# Patient Record
Sex: Female | Born: 1940 | Race: White | Hispanic: No | Marital: Single
Health system: Southern US, Community
[De-identification: ages and names within clinical notes are randomized; demographics above are authoritative.]

## PROBLEM LIST (undated history)

## (undated) DIAGNOSIS — I10 Essential (primary) hypertension: Secondary | ICD-10-CM

## (undated) DIAGNOSIS — C801 Malignant (primary) neoplasm, unspecified: Secondary | ICD-10-CM

## (undated) DIAGNOSIS — F329 Major depressive disorder, single episode, unspecified: Secondary | ICD-10-CM

## (undated) DIAGNOSIS — F32A Depression, unspecified: Secondary | ICD-10-CM

## (undated) DIAGNOSIS — F419 Anxiety disorder, unspecified: Secondary | ICD-10-CM

## (undated) HISTORY — DX: Anxiety disorder, unspecified: F41.9

---
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
## 1981-08-16 DIAGNOSIS — C801 Malignant (primary) neoplasm, unspecified: Secondary | ICD-10-CM

## 1981-08-16 HISTORY — DX: Malignant (primary) neoplasm, unspecified: C80.1

## 2013-08-20 DIAGNOSIS — Z79899 Other long term (current) drug therapy: Secondary | ICD-10-CM | POA: Diagnosis not present

## 2013-08-21 DIAGNOSIS — Z79899 Other long term (current) drug therapy: Secondary | ICD-10-CM | POA: Diagnosis not present

## 2013-08-21 DIAGNOSIS — L708 Other acne: Secondary | ICD-10-CM | POA: Diagnosis not present

## 2013-08-27 DIAGNOSIS — F332 Major depressive disorder, recurrent severe without psychotic features: Secondary | ICD-10-CM | POA: Diagnosis not present

## 2013-09-24 DIAGNOSIS — Z79899 Other long term (current) drug therapy: Secondary | ICD-10-CM | POA: Diagnosis not present

## 2013-09-24 DIAGNOSIS — L708 Other acne: Secondary | ICD-10-CM | POA: Diagnosis not present

## 2013-10-02 DIAGNOSIS — F332 Major depressive disorder, recurrent severe without psychotic features: Secondary | ICD-10-CM | POA: Diagnosis not present

## 2013-10-12 DIAGNOSIS — R002 Palpitations: Secondary | ICD-10-CM | POA: Diagnosis not present

## 2013-10-12 DIAGNOSIS — E785 Hyperlipidemia, unspecified: Secondary | ICD-10-CM | POA: Diagnosis not present

## 2013-10-12 DIAGNOSIS — F329 Major depressive disorder, single episode, unspecified: Secondary | ICD-10-CM | POA: Diagnosis not present

## 2013-10-12 DIAGNOSIS — F3289 Other specified depressive episodes: Secondary | ICD-10-CM | POA: Diagnosis not present

## 2013-10-31 DIAGNOSIS — Z79899 Other long term (current) drug therapy: Secondary | ICD-10-CM | POA: Diagnosis not present

## 2013-10-31 DIAGNOSIS — L708 Other acne: Secondary | ICD-10-CM | POA: Diagnosis not present

## 2013-11-09 DIAGNOSIS — F332 Major depressive disorder, recurrent severe without psychotic features: Secondary | ICD-10-CM | POA: Diagnosis not present

## 2013-11-29 DIAGNOSIS — D1430 Benign neoplasm of unspecified bronchus and lung: Secondary | ICD-10-CM | POA: Diagnosis not present

## 2013-11-29 DIAGNOSIS — R911 Solitary pulmonary nodule: Secondary | ICD-10-CM | POA: Diagnosis not present

## 2013-12-10 DIAGNOSIS — F332 Major depressive disorder, recurrent severe without psychotic features: Secondary | ICD-10-CM | POA: Diagnosis not present

## 2014-01-01 DIAGNOSIS — F332 Major depressive disorder, recurrent severe without psychotic features: Secondary | ICD-10-CM | POA: Diagnosis not present

## 2014-01-10 DIAGNOSIS — H40009 Preglaucoma, unspecified, unspecified eye: Secondary | ICD-10-CM | POA: Diagnosis not present

## 2014-01-10 DIAGNOSIS — H04129 Dry eye syndrome of unspecified lacrimal gland: Secondary | ICD-10-CM | POA: Diagnosis not present

## 2014-01-10 DIAGNOSIS — H2510 Age-related nuclear cataract, unspecified eye: Secondary | ICD-10-CM | POA: Diagnosis not present

## 2014-01-18 DIAGNOSIS — H40009 Preglaucoma, unspecified, unspecified eye: Secondary | ICD-10-CM | POA: Diagnosis not present

## 2014-01-18 DIAGNOSIS — H2510 Age-related nuclear cataract, unspecified eye: Secondary | ICD-10-CM | POA: Diagnosis not present

## 2014-01-29 DIAGNOSIS — F332 Major depressive disorder, recurrent severe without psychotic features: Secondary | ICD-10-CM | POA: Diagnosis not present

## 2014-02-12 DIAGNOSIS — H2510 Age-related nuclear cataract, unspecified eye: Secondary | ICD-10-CM | POA: Diagnosis not present

## 2014-02-20 DIAGNOSIS — H4050X0 Glaucoma secondary to other eye disorders, unspecified eye, stage unspecified: Secondary | ICD-10-CM | POA: Diagnosis not present

## 2014-02-20 DIAGNOSIS — H2510 Age-related nuclear cataract, unspecified eye: Secondary | ICD-10-CM | POA: Diagnosis not present

## 2014-02-20 DIAGNOSIS — T8529XA Other mechanical complication of intraocular lens, initial encounter: Secondary | ICD-10-CM | POA: Diagnosis not present

## 2014-02-20 DIAGNOSIS — IMO0002 Reserved for concepts with insufficient information to code with codable children: Secondary | ICD-10-CM | POA: Diagnosis not present

## 2014-02-20 DIAGNOSIS — H269 Unspecified cataract: Secondary | ICD-10-CM | POA: Diagnosis not present

## 2014-02-20 DIAGNOSIS — H259 Unspecified age-related cataract: Secondary | ICD-10-CM | POA: Diagnosis not present

## 2014-02-21 DIAGNOSIS — F3289 Other specified depressive episodes: Secondary | ICD-10-CM | POA: Diagnosis not present

## 2014-02-21 DIAGNOSIS — M129 Arthropathy, unspecified: Secondary | ICD-10-CM | POA: Diagnosis not present

## 2014-02-21 DIAGNOSIS — F329 Major depressive disorder, single episode, unspecified: Secondary | ICD-10-CM | POA: Diagnosis not present

## 2014-02-21 DIAGNOSIS — T8529XA Other mechanical complication of intraocular lens, initial encounter: Secondary | ICD-10-CM | POA: Diagnosis not present

## 2014-02-21 DIAGNOSIS — I4892 Unspecified atrial flutter: Secondary | ICD-10-CM | POA: Diagnosis not present

## 2014-02-27 DIAGNOSIS — H4440 Unspecified hypotony of eye: Secondary | ICD-10-CM | POA: Diagnosis not present

## 2014-02-27 DIAGNOSIS — H318 Other specified disorders of choroid: Secondary | ICD-10-CM | POA: Diagnosis not present

## 2014-02-27 DIAGNOSIS — H35349 Macular cyst, hole, or pseudohole, unspecified eye: Secondary | ICD-10-CM | POA: Diagnosis not present

## 2014-02-28 DIAGNOSIS — H318 Other specified disorders of choroid: Secondary | ICD-10-CM | POA: Diagnosis not present

## 2014-02-28 DIAGNOSIS — H3090 Unspecified chorioretinal inflammation, unspecified eye: Secondary | ICD-10-CM | POA: Diagnosis not present

## 2014-02-28 DIAGNOSIS — H4440 Unspecified hypotony of eye: Secondary | ICD-10-CM | POA: Diagnosis not present

## 2014-03-15 DIAGNOSIS — E538 Deficiency of other specified B group vitamins: Secondary | ICD-10-CM | POA: Diagnosis not present

## 2014-03-15 DIAGNOSIS — E785 Hyperlipidemia, unspecified: Secondary | ICD-10-CM | POA: Diagnosis not present

## 2014-03-15 DIAGNOSIS — E559 Vitamin D deficiency, unspecified: Secondary | ICD-10-CM | POA: Diagnosis not present

## 2014-03-19 DIAGNOSIS — M159 Polyosteoarthritis, unspecified: Secondary | ICD-10-CM | POA: Diagnosis not present

## 2014-03-19 DIAGNOSIS — E559 Vitamin D deficiency, unspecified: Secondary | ICD-10-CM | POA: Diagnosis not present

## 2014-03-19 DIAGNOSIS — E785 Hyperlipidemia, unspecified: Secondary | ICD-10-CM | POA: Diagnosis not present

## 2014-03-19 DIAGNOSIS — E538 Deficiency of other specified B group vitamins: Secondary | ICD-10-CM | POA: Diagnosis not present

## 2014-03-19 DIAGNOSIS — F3289 Other specified depressive episodes: Secondary | ICD-10-CM | POA: Diagnosis not present

## 2014-03-19 DIAGNOSIS — F329 Major depressive disorder, single episode, unspecified: Secondary | ICD-10-CM | POA: Diagnosis not present

## 2014-04-19 DIAGNOSIS — I4892 Unspecified atrial flutter: Secondary | ICD-10-CM | POA: Diagnosis not present

## 2014-04-30 DIAGNOSIS — F332 Major depressive disorder, recurrent severe without psychotic features: Secondary | ICD-10-CM | POA: Diagnosis not present

## 2014-05-02 DIAGNOSIS — Z23 Encounter for immunization: Secondary | ICD-10-CM | POA: Diagnosis not present

## 2014-06-03 DIAGNOSIS — F332 Major depressive disorder, recurrent severe without psychotic features: Secondary | ICD-10-CM | POA: Diagnosis not present

## 2014-06-11 DIAGNOSIS — H2512 Age-related nuclear cataract, left eye: Secondary | ICD-10-CM | POA: Diagnosis not present

## 2014-07-02 DIAGNOSIS — F332 Major depressive disorder, recurrent severe without psychotic features: Secondary | ICD-10-CM | POA: Diagnosis not present

## 2014-07-15 DIAGNOSIS — H59032 Cystoid macular edema following cataract surgery, left eye: Secondary | ICD-10-CM | POA: Diagnosis not present

## 2014-07-15 DIAGNOSIS — H2511 Age-related nuclear cataract, right eye: Secondary | ICD-10-CM | POA: Diagnosis not present

## 2014-08-01 DIAGNOSIS — F332 Major depressive disorder, recurrent severe without psychotic features: Secondary | ICD-10-CM | POA: Diagnosis not present

## 2014-08-26 DIAGNOSIS — H2511 Age-related nuclear cataract, right eye: Secondary | ICD-10-CM | POA: Diagnosis not present

## 2014-08-26 DIAGNOSIS — H40023 Open angle with borderline findings, high risk, bilateral: Secondary | ICD-10-CM | POA: Diagnosis not present

## 2014-08-26 DIAGNOSIS — H59032 Cystoid macular edema following cataract surgery, left eye: Secondary | ICD-10-CM | POA: Diagnosis not present

## 2014-09-04 DIAGNOSIS — F332 Major depressive disorder, recurrent severe without psychotic features: Secondary | ICD-10-CM | POA: Diagnosis not present

## 2014-09-17 DIAGNOSIS — M1990 Unspecified osteoarthritis, unspecified site: Secondary | ICD-10-CM | POA: Diagnosis not present

## 2014-09-17 DIAGNOSIS — Z1231 Encounter for screening mammogram for malignant neoplasm of breast: Secondary | ICD-10-CM | POA: Diagnosis not present

## 2014-09-17 DIAGNOSIS — E785 Hyperlipidemia, unspecified: Secondary | ICD-10-CM | POA: Diagnosis not present

## 2014-09-17 DIAGNOSIS — N898 Other specified noninflammatory disorders of vagina: Secondary | ICD-10-CM | POA: Diagnosis not present

## 2014-09-17 DIAGNOSIS — F329 Major depressive disorder, single episode, unspecified: Secondary | ICD-10-CM | POA: Diagnosis not present

## 2014-09-17 DIAGNOSIS — Z0001 Encounter for general adult medical examination with abnormal findings: Secondary | ICD-10-CM | POA: Diagnosis not present

## 2014-09-17 DIAGNOSIS — H269 Unspecified cataract: Secondary | ICD-10-CM | POA: Diagnosis not present

## 2014-09-19 DIAGNOSIS — T8522XD Displacement of intraocular lens, subsequent encounter: Secondary | ICD-10-CM | POA: Diagnosis not present

## 2014-09-19 DIAGNOSIS — H2511 Age-related nuclear cataract, right eye: Secondary | ICD-10-CM | POA: Diagnosis not present

## 2014-09-19 DIAGNOSIS — H35372 Puckering of macula, left eye: Secondary | ICD-10-CM | POA: Diagnosis not present

## 2014-10-08 DIAGNOSIS — Z85820 Personal history of malignant melanoma of skin: Secondary | ICD-10-CM | POA: Diagnosis not present

## 2014-10-08 DIAGNOSIS — Z1231 Encounter for screening mammogram for malignant neoplasm of breast: Secondary | ICD-10-CM | POA: Diagnosis not present

## 2014-10-09 DIAGNOSIS — F332 Major depressive disorder, recurrent severe without psychotic features: Secondary | ICD-10-CM | POA: Diagnosis not present

## 2014-10-09 DIAGNOSIS — H35352 Cystoid macular degeneration, left eye: Secondary | ICD-10-CM | POA: Diagnosis not present

## 2014-10-24 DIAGNOSIS — R002 Palpitations: Secondary | ICD-10-CM | POA: Diagnosis not present

## 2014-11-04 DIAGNOSIS — H40053 Ocular hypertension, bilateral: Secondary | ICD-10-CM | POA: Diagnosis not present

## 2014-11-04 DIAGNOSIS — F332 Major depressive disorder, recurrent severe without psychotic features: Secondary | ICD-10-CM | POA: Diagnosis not present

## 2014-12-02 DIAGNOSIS — H35372 Puckering of macula, left eye: Secondary | ICD-10-CM | POA: Diagnosis not present

## 2014-12-02 DIAGNOSIS — Z961 Presence of intraocular lens: Secondary | ICD-10-CM | POA: Diagnosis not present

## 2014-12-02 DIAGNOSIS — Z7952 Long term (current) use of systemic steroids: Secondary | ICD-10-CM | POA: Diagnosis not present

## 2014-12-02 DIAGNOSIS — Z79899 Other long term (current) drug therapy: Secondary | ICD-10-CM | POA: Diagnosis not present

## 2014-12-02 DIAGNOSIS — H25811 Combined forms of age-related cataract, right eye: Secondary | ICD-10-CM | POA: Diagnosis not present

## 2015-01-21 DIAGNOSIS — F064 Anxiety disorder due to known physiological condition: Secondary | ICD-10-CM | POA: Diagnosis not present

## 2015-01-21 DIAGNOSIS — Z86010 Personal history of colonic polyps: Secondary | ICD-10-CM | POA: Diagnosis not present

## 2015-01-21 DIAGNOSIS — Z85820 Personal history of malignant melanoma of skin: Secondary | ICD-10-CM | POA: Diagnosis not present

## 2015-01-21 DIAGNOSIS — I10 Essential (primary) hypertension: Secondary | ICD-10-CM | POA: Diagnosis not present

## 2015-01-21 DIAGNOSIS — F419 Anxiety disorder, unspecified: Secondary | ICD-10-CM | POA: Diagnosis not present

## 2015-01-21 DIAGNOSIS — H25811 Combined forms of age-related cataract, right eye: Secondary | ICD-10-CM | POA: Diagnosis not present

## 2015-01-22 DIAGNOSIS — H35372 Puckering of macula, left eye: Secondary | ICD-10-CM | POA: Diagnosis not present

## 2015-01-22 DIAGNOSIS — Z961 Presence of intraocular lens: Secondary | ICD-10-CM | POA: Diagnosis not present

## 2015-01-22 DIAGNOSIS — Z9841 Cataract extraction status, right eye: Secondary | ICD-10-CM | POA: Diagnosis not present

## 2015-02-05 DIAGNOSIS — Z48810 Encounter for surgical aftercare following surgery on the sense organs: Secondary | ICD-10-CM | POA: Diagnosis not present

## 2015-02-05 DIAGNOSIS — Z961 Presence of intraocular lens: Secondary | ICD-10-CM | POA: Diagnosis not present

## 2015-02-05 DIAGNOSIS — Z9841 Cataract extraction status, right eye: Secondary | ICD-10-CM | POA: Diagnosis not present

## 2015-02-05 DIAGNOSIS — H35372 Puckering of macula, left eye: Secondary | ICD-10-CM | POA: Diagnosis not present

## 2015-02-25 DIAGNOSIS — F411 Generalized anxiety disorder: Secondary | ICD-10-CM | POA: Diagnosis not present

## 2015-02-26 DIAGNOSIS — Z9842 Cataract extraction status, left eye: Secondary | ICD-10-CM | POA: Diagnosis not present

## 2015-02-26 DIAGNOSIS — Z9841 Cataract extraction status, right eye: Secondary | ICD-10-CM | POA: Diagnosis not present

## 2015-02-26 DIAGNOSIS — Z961 Presence of intraocular lens: Secondary | ICD-10-CM | POA: Diagnosis not present

## 2015-04-10 DIAGNOSIS — F411 Generalized anxiety disorder: Secondary | ICD-10-CM | POA: Diagnosis not present

## 2015-05-13 DIAGNOSIS — F411 Generalized anxiety disorder: Secondary | ICD-10-CM | POA: Diagnosis not present

## 2015-05-26 DIAGNOSIS — F411 Generalized anxiety disorder: Secondary | ICD-10-CM | POA: Diagnosis not present

## 2015-06-05 DIAGNOSIS — Z23 Encounter for immunization: Secondary | ICD-10-CM | POA: Diagnosis not present

## 2015-06-16 DIAGNOSIS — F331 Major depressive disorder, recurrent, moderate: Secondary | ICD-10-CM | POA: Diagnosis not present

## 2015-06-16 DIAGNOSIS — F411 Generalized anxiety disorder: Secondary | ICD-10-CM | POA: Diagnosis not present

## 2015-06-22 DIAGNOSIS — Z23 Encounter for immunization: Secondary | ICD-10-CM | POA: Diagnosis not present

## 2015-07-03 DIAGNOSIS — F411 Generalized anxiety disorder: Secondary | ICD-10-CM | POA: Diagnosis not present

## 2015-07-03 DIAGNOSIS — F331 Major depressive disorder, recurrent, moderate: Secondary | ICD-10-CM | POA: Diagnosis not present

## 2015-07-14 DIAGNOSIS — F331 Major depressive disorder, recurrent, moderate: Secondary | ICD-10-CM | POA: Diagnosis not present

## 2015-07-18 DIAGNOSIS — E538 Deficiency of other specified B group vitamins: Secondary | ICD-10-CM | POA: Diagnosis not present

## 2015-07-18 DIAGNOSIS — F322 Major depressive disorder, single episode, severe without psychotic features: Secondary | ICD-10-CM | POA: Diagnosis not present

## 2015-07-18 DIAGNOSIS — E7800 Pure hypercholesterolemia, unspecified: Secondary | ICD-10-CM | POA: Diagnosis not present

## 2015-07-18 DIAGNOSIS — J069 Acute upper respiratory infection, unspecified: Secondary | ICD-10-CM | POA: Diagnosis not present

## 2015-07-18 DIAGNOSIS — Z79899 Other long term (current) drug therapy: Secondary | ICD-10-CM | POA: Diagnosis not present

## 2015-07-18 DIAGNOSIS — I4892 Unspecified atrial flutter: Secondary | ICD-10-CM | POA: Diagnosis not present

## 2015-07-18 DIAGNOSIS — I10 Essential (primary) hypertension: Secondary | ICD-10-CM | POA: Diagnosis not present

## 2015-07-18 DIAGNOSIS — E559 Vitamin D deficiency, unspecified: Secondary | ICD-10-CM | POA: Diagnosis not present

## 2015-09-02 DIAGNOSIS — F411 Generalized anxiety disorder: Secondary | ICD-10-CM | POA: Diagnosis not present

## 2015-09-02 DIAGNOSIS — S30814A Abrasion of vagina and vulva, initial encounter: Secondary | ICD-10-CM | POA: Diagnosis not present

## 2015-09-02 DIAGNOSIS — R32 Unspecified urinary incontinence: Secondary | ICD-10-CM | POA: Diagnosis not present

## 2015-09-02 DIAGNOSIS — F331 Major depressive disorder, recurrent, moderate: Secondary | ICD-10-CM | POA: Diagnosis not present

## 2015-09-09 DIAGNOSIS — Z9841 Cataract extraction status, right eye: Secondary | ICD-10-CM | POA: Diagnosis not present

## 2015-09-09 DIAGNOSIS — Z48810 Encounter for surgical aftercare following surgery on the sense organs: Secondary | ICD-10-CM | POA: Diagnosis not present

## 2015-09-09 DIAGNOSIS — Z79899 Other long term (current) drug therapy: Secondary | ICD-10-CM | POA: Diagnosis not present

## 2015-09-09 DIAGNOSIS — Z4802 Encounter for removal of sutures: Secondary | ICD-10-CM | POA: Diagnosis not present

## 2015-09-09 DIAGNOSIS — H35372 Puckering of macula, left eye: Secondary | ICD-10-CM | POA: Diagnosis not present

## 2015-09-09 DIAGNOSIS — Z961 Presence of intraocular lens: Secondary | ICD-10-CM | POA: Diagnosis not present

## 2015-09-30 DIAGNOSIS — F331 Major depressive disorder, recurrent, moderate: Secondary | ICD-10-CM | POA: Diagnosis not present

## 2015-09-30 DIAGNOSIS — F411 Generalized anxiety disorder: Secondary | ICD-10-CM | POA: Diagnosis not present

## 2015-10-15 DIAGNOSIS — F331 Major depressive disorder, recurrent, moderate: Secondary | ICD-10-CM | POA: Diagnosis not present

## 2015-10-21 DIAGNOSIS — F331 Major depressive disorder, recurrent, moderate: Secondary | ICD-10-CM | POA: Diagnosis not present

## 2015-10-21 DIAGNOSIS — F411 Generalized anxiety disorder: Secondary | ICD-10-CM | POA: Diagnosis not present

## 2015-11-11 DIAGNOSIS — F331 Major depressive disorder, recurrent, moderate: Secondary | ICD-10-CM | POA: Diagnosis not present

## 2015-11-11 DIAGNOSIS — F411 Generalized anxiety disorder: Secondary | ICD-10-CM | POA: Diagnosis not present

## 2015-12-01 DIAGNOSIS — F411 Generalized anxiety disorder: Secondary | ICD-10-CM | POA: Diagnosis not present

## 2015-12-01 DIAGNOSIS — F331 Major depressive disorder, recurrent, moderate: Secondary | ICD-10-CM | POA: Diagnosis not present

## 2015-12-15 DIAGNOSIS — F331 Major depressive disorder, recurrent, moderate: Secondary | ICD-10-CM | POA: Diagnosis not present

## 2015-12-22 DIAGNOSIS — F331 Major depressive disorder, recurrent, moderate: Secondary | ICD-10-CM | POA: Diagnosis not present

## 2015-12-22 DIAGNOSIS — F411 Generalized anxiety disorder: Secondary | ICD-10-CM | POA: Diagnosis not present

## 2016-01-20 DIAGNOSIS — F411 Generalized anxiety disorder: Secondary | ICD-10-CM | POA: Diagnosis not present

## 2016-01-20 DIAGNOSIS — F331 Major depressive disorder, recurrent, moderate: Secondary | ICD-10-CM | POA: Diagnosis not present

## 2016-02-18 DIAGNOSIS — F331 Major depressive disorder, recurrent, moderate: Secondary | ICD-10-CM | POA: Diagnosis not present

## 2016-03-10 DIAGNOSIS — F411 Generalized anxiety disorder: Secondary | ICD-10-CM | POA: Diagnosis not present

## 2016-03-10 DIAGNOSIS — F331 Major depressive disorder, recurrent, moderate: Secondary | ICD-10-CM | POA: Diagnosis not present

## 2016-03-30 DIAGNOSIS — F411 Generalized anxiety disorder: Secondary | ICD-10-CM | POA: Diagnosis not present

## 2016-03-30 DIAGNOSIS — F331 Major depressive disorder, recurrent, moderate: Secondary | ICD-10-CM | POA: Diagnosis not present

## 2016-04-21 DIAGNOSIS — F331 Major depressive disorder, recurrent, moderate: Secondary | ICD-10-CM | POA: Diagnosis not present

## 2016-04-21 DIAGNOSIS — F411 Generalized anxiety disorder: Secondary | ICD-10-CM | POA: Diagnosis not present

## 2016-05-05 DIAGNOSIS — Z23 Encounter for immunization: Secondary | ICD-10-CM | POA: Diagnosis not present

## 2016-05-18 DIAGNOSIS — F331 Major depressive disorder, recurrent, moderate: Secondary | ICD-10-CM | POA: Diagnosis not present

## 2016-06-10 DIAGNOSIS — F331 Major depressive disorder, recurrent, moderate: Secondary | ICD-10-CM | POA: Diagnosis not present

## 2016-06-10 DIAGNOSIS — F411 Generalized anxiety disorder: Secondary | ICD-10-CM | POA: Diagnosis not present

## 2016-07-01 DIAGNOSIS — F411 Generalized anxiety disorder: Secondary | ICD-10-CM | POA: Diagnosis not present

## 2016-07-01 DIAGNOSIS — F331 Major depressive disorder, recurrent, moderate: Secondary | ICD-10-CM | POA: Diagnosis not present

## 2016-07-26 DIAGNOSIS — F331 Major depressive disorder, recurrent, moderate: Secondary | ICD-10-CM | POA: Diagnosis not present

## 2016-07-26 DIAGNOSIS — F411 Generalized anxiety disorder: Secondary | ICD-10-CM | POA: Diagnosis not present

## 2016-08-06 DIAGNOSIS — I4892 Unspecified atrial flutter: Secondary | ICD-10-CM | POA: Diagnosis not present

## 2016-08-06 DIAGNOSIS — E7800 Pure hypercholesterolemia, unspecified: Secondary | ICD-10-CM | POA: Diagnosis not present

## 2016-08-17 DIAGNOSIS — F331 Major depressive disorder, recurrent, moderate: Secondary | ICD-10-CM | POA: Diagnosis not present

## 2016-08-18 DIAGNOSIS — F331 Major depressive disorder, recurrent, moderate: Secondary | ICD-10-CM | POA: Diagnosis not present

## 2016-08-18 DIAGNOSIS — F411 Generalized anxiety disorder: Secondary | ICD-10-CM | POA: Diagnosis not present

## 2016-09-03 ENCOUNTER — Encounter (HOSPITAL_COMMUNITY): Payer: Self-pay | Admitting: Emergency Medicine

## 2016-09-03 ENCOUNTER — Emergency Department (HOSPITAL_COMMUNITY)
Admission: EM | Admit: 2016-09-03 | Discharge: 2016-09-03 | Disposition: A | Discharge status: Home / Self Care | Payer: Medicare Other | Attending: Emergency Medicine | Admitting: Emergency Medicine

## 2016-09-03 DIAGNOSIS — T43295A Adverse effect of other antidepressants, initial encounter: Secondary | ICD-10-CM | POA: Insufficient documentation

## 2016-09-03 DIAGNOSIS — T50905A Adverse effect of unspecified drugs, medicaments and biological substances, initial encounter: Secondary | ICD-10-CM

## 2016-09-03 DIAGNOSIS — R251 Tremor, unspecified: Secondary | ICD-10-CM | POA: Insufficient documentation

## 2016-09-03 DIAGNOSIS — I10 Essential (primary) hypertension: Secondary | ICD-10-CM | POA: Insufficient documentation

## 2016-09-03 DIAGNOSIS — I6789 Other cerebrovascular disease: Secondary | ICD-10-CM | POA: Diagnosis not present

## 2016-09-03 DIAGNOSIS — Y638 Failure in dosage during other surgical and medical care: Secondary | ICD-10-CM | POA: Diagnosis not present

## 2016-09-03 DIAGNOSIS — Z79899 Other long term (current) drug therapy: Secondary | ICD-10-CM | POA: Diagnosis not present

## 2016-09-03 HISTORY — DX: Depression, unspecified: F32.A

## 2016-09-03 HISTORY — DX: Major depressive disorder, single episode, unspecified: F32.9

## 2016-09-03 HISTORY — DX: Essential (primary) hypertension: I10

## 2016-09-03 NOTE — Discharge Instructions (Signed)
Cut your Wellbutrin dose to what it was previously and contact your psychiatrist for further recommendations.

## 2016-09-03 NOTE — ED Triage Notes (Signed)
Patient brought in by EMS from home after she developed tremors and balance instability.  Patient reports she was started on Wellbutrin on the 9th of January and three days ago her dosage was increased.  Patient reports her symptoms started after that.   Also having swishing sounds in her ears.  No neuro deficits, [id number].  Patient able to walk in home while EMS there.  Patient alert and oriented but anxious.

## 2016-09-03 NOTE — ED Provider Notes (Signed)
[facility] DEPT Provider Note   CSN: [id number] Arrival date & time: 09/03/16  1523     History   Chief Complaint Chief Complaint  Patient presents with  . Medical Clearance    HPI Kathryn Snyder is a 76 y.o. female.  The history is provided by the patient.  CC: trouble walking  Onset/Duration: 1 week Timing: intermittent; sporatic Quality: feeling off balance Severity: moderate Modifying Factors:  Improved by: self resolving  Worsened by: nothing Associated Signs/Symptoms:  Pertinent (+): intermittent tremors  Pertinent (-): fevers, chills, falls, head trauma, focal deficits,  Context: pt's Wellbutrin was doubled just prior to onset of symptoms.   Past Medical History:  Diagnosis Date  . Depression   . Hypertension     There are no active problems to display for this patient.   History reviewed. No pertinent surgical history.  OB History    No data available       Home Medications    Prior to Admission medications   Medication Sig Start Date End Date Taking? Authorizing Provider  BIOTIN PO Take 1 tablet by mouth daily.   Yes Historical Provider, MD  buPROPion (WELLBUTRIN XL) 150 MG 24 hr tablet Take 300 mg by mouth daily.   Yes Historical Provider, MD  cholecalciferol (VITAMIN D) 1000 units tablet Take 1,000 Units by mouth daily.   Yes Historical Provider, MD  FLUoxetine (PROZAC) 20 MG capsule Take 20 mg by mouth daily.   Yes Historical Provider, MD  ibuprofen (ADVIL,MOTRIN) 200 MG tablet Take 200 mg by mouth every 8 (eight) hours as needed for mild pain or moderate pain.   Yes Historical Provider, MD  LORazepam (ATIVAN) 0.5 MG tablet Take 0.5 mg by mouth at bedtime as needed for anxiety.   Yes Historical Provider, MD  metoprolol succinate (TOPROL-XL) 25 MG 24 hr tablet Take 12.5 mg by mouth daily.   Yes Historical Provider, MD  rosuvastatin (CRESTOR) 20 MG tablet Take 20 mg by mouth daily.   Yes Historical Provider, MD    Family History No family  history on file.  Social History Social History  Substance Use Topics  . Smoking status: Not on file  . Smokeless tobacco: Not on file  . Alcohol use Not on file     Allergies   Patient has no allergy information on record.   Review of Systems Review of Systems Ten systems are reviewed and are negative for acute change except as noted in the HPI   Physical Exam Updated Vital Signs BP 144/78 (BP Location: Right Arm)   Pulse 89   Temp 98.5 F (36.9 C) (Oral)   Resp 18   SpO2 97%   Physical Exam  Constitutional: She is oriented to person, place, and time. She appears well-developed and well-nourished. No distress.  HENT:  Head: Normocephalic and atraumatic.  Nose: Nose normal.  Eyes: Conjunctivae and EOM are normal. Pupils are equal, round, and reactive to light. Right eye exhibits no discharge. Left eye exhibits no discharge. No scleral icterus.  Neck: Normal range of motion. Neck supple.  Cardiovascular: Normal rate and regular rhythm.  Exam reveals no gallop and no friction rub.   No murmur heard. Pulmonary/Chest: Effort normal and breath sounds normal. No stridor. No respiratory distress. She has no rales.  Abdominal: Soft. She exhibits no distension. There is no tenderness.  Musculoskeletal: She exhibits no edema or tenderness.  Neurological: She is alert and oriented to person, place, and time.  Mental Status: Alert and oriented  to person, place, and time. Attention and concentration normal. Speech clear. Recent memory is intac  Cranial Nerves  II Visual Fields: Intact to confrontation. Visual fields intact. III, IV, VI: Pupils equal and reactive to light and near. Full eye movement without nystagmus  V Facial Sensation: Normal. No weakness of masticatory muscles  VII: No facial weakness or asymmetry  VIII Auditory Acuity: Grossly normal  IX/X: The uvula is midline; the palate elevates symmetrically  XI: Normal sternocleidomastoid and trapezius strength  XII:  The tongue is midline. No atrophy or fasciculations.   Motor System: Muscle Strength: 5/5 and symmetric in the upper and lower extremities. No pronation or drift.  Muscle Tone: Tone and muscle bulk are normal in the upper and lower extremities.   Reflexes: DTRs: 2+ and symmetrical in all four extremities. Plantar responses are flexor bilaterally.  Coordination: Intact finger-to-nose, heel-to-shin, and rapid alternating movements. Essential  tremor.  Sensation: Intact to light touch, and pinprick. Negative Romberg test.  Gait: shuffling gait.    Skin: Skin is warm and dry. No rash noted. She is not diaphoretic. No erythema.  Psychiatric: She has a normal mood and affect.  Vitals reviewed.    ED Treatments / Results  Labs (all labs ordered are listed, but only abnormal results are displayed) Labs Reviewed - No data to display  EKG  EKG Interpretation None       Radiology No results found.  Procedures Procedures (including critical care time)  Medications Ordered in ED Medications - No data to display   Initial Impression / Assessment and Plan / ED Course  I have reviewed the triage vital signs and the nursing notes.  Pertinent labs & imaging results that were available during my care of the patient were reviewed by me and considered in my medical decision making (see chart for details).  Clinical Course as of Sep 03 1841  Fri Sep 03, 2016  1842 Likely side effect for increased dose of Wellbutrin given the known side effect profile. No acute deficits to suggest CVA. Able to ambulate well here.  The patient is safe for discharge with strict return precautions.   [PC]    Clinical Course User Index [PC] Fatima Blank, MD      Final Clinical Impressions(s) / ED Diagnoses   Final diagnoses:  Tremor  Adverse effect of drug, initial encounter   Disposition: Discharge  Condition: Good  I have discussed the results, Dx and Tx plan with the patient who  expressed understanding and agree(s) with the plan. Discharge instructions discussed at great length. The patient was given strict return precautions who verbalized understanding of the instructions. No further questions at time of discharge.    New Prescriptions   No medications on file    Follow Up: Psychiatrist     Jonathon Jordan, MD [place] [address] [address] [phone]   As needed      Fatima Blank, MD 09/03/16 1843

## 2016-09-13 DIAGNOSIS — F411 Generalized anxiety disorder: Secondary | ICD-10-CM | POA: Diagnosis not present

## 2016-09-13 DIAGNOSIS — F331 Major depressive disorder, recurrent, moderate: Secondary | ICD-10-CM | POA: Diagnosis not present

## 2016-10-04 DIAGNOSIS — F331 Major depressive disorder, recurrent, moderate: Secondary | ICD-10-CM | POA: Diagnosis not present

## 2016-10-04 DIAGNOSIS — F411 Generalized anxiety disorder: Secondary | ICD-10-CM | POA: Diagnosis not present

## 2016-10-12 DIAGNOSIS — F331 Major depressive disorder, recurrent, moderate: Secondary | ICD-10-CM | POA: Diagnosis not present

## 2016-11-02 DIAGNOSIS — F331 Major depressive disorder, recurrent, moderate: Secondary | ICD-10-CM | POA: Diagnosis not present

## 2016-11-02 DIAGNOSIS — F411 Generalized anxiety disorder: Secondary | ICD-10-CM | POA: Diagnosis not present

## 2016-11-23 DIAGNOSIS — F411 Generalized anxiety disorder: Secondary | ICD-10-CM | POA: Diagnosis not present

## 2016-11-23 DIAGNOSIS — F331 Major depressive disorder, recurrent, moderate: Secondary | ICD-10-CM | POA: Diagnosis not present

## 2016-11-24 DIAGNOSIS — E7800 Pure hypercholesterolemia, unspecified: Secondary | ICD-10-CM | POA: Diagnosis not present

## 2016-11-24 DIAGNOSIS — Z79899 Other long term (current) drug therapy: Secondary | ICD-10-CM | POA: Diagnosis not present

## 2016-11-24 DIAGNOSIS — I10 Essential (primary) hypertension: Secondary | ICD-10-CM | POA: Diagnosis not present

## 2016-11-24 DIAGNOSIS — E559 Vitamin D deficiency, unspecified: Secondary | ICD-10-CM | POA: Diagnosis not present

## 2016-11-24 DIAGNOSIS — F322 Major depressive disorder, single episode, severe without psychotic features: Secondary | ICD-10-CM | POA: Diagnosis not present

## 2016-11-24 DIAGNOSIS — Z0000 Encounter for general adult medical examination without abnormal findings: Secondary | ICD-10-CM | POA: Diagnosis not present

## 2016-11-24 DIAGNOSIS — M1990 Unspecified osteoarthritis, unspecified site: Secondary | ICD-10-CM | POA: Diagnosis not present

## 2016-11-24 DIAGNOSIS — D692 Other nonthrombocytopenic purpura: Secondary | ICD-10-CM | POA: Diagnosis not present

## 2016-11-30 ENCOUNTER — Other Ambulatory Visit: Payer: Self-pay | Admitting: *Deleted

## 2016-12-14 DIAGNOSIS — F411 Generalized anxiety disorder: Secondary | ICD-10-CM | POA: Diagnosis not present

## 2016-12-14 DIAGNOSIS — F331 Major depressive disorder, recurrent, moderate: Secondary | ICD-10-CM | POA: Diagnosis not present

## 2017-01-03 DIAGNOSIS — F331 Major depressive disorder, recurrent, moderate: Secondary | ICD-10-CM | POA: Diagnosis not present

## 2017-01-04 DIAGNOSIS — F331 Major depressive disorder, recurrent, moderate: Secondary | ICD-10-CM | POA: Diagnosis not present

## 2017-01-04 DIAGNOSIS — F411 Generalized anxiety disorder: Secondary | ICD-10-CM | POA: Diagnosis not present

## 2017-01-24 DIAGNOSIS — F411 Generalized anxiety disorder: Secondary | ICD-10-CM | POA: Diagnosis not present

## 2017-01-24 DIAGNOSIS — F331 Major depressive disorder, recurrent, moderate: Secondary | ICD-10-CM | POA: Diagnosis not present

## 2017-02-18 DIAGNOSIS — F331 Major depressive disorder, recurrent, moderate: Secondary | ICD-10-CM | POA: Diagnosis not present

## 2017-02-18 DIAGNOSIS — F411 Generalized anxiety disorder: Secondary | ICD-10-CM | POA: Diagnosis not present

## 2017-02-21 DIAGNOSIS — F411 Generalized anxiety disorder: Secondary | ICD-10-CM | POA: Diagnosis not present

## 2017-04-05 DIAGNOSIS — F411 Generalized anxiety disorder: Secondary | ICD-10-CM | POA: Diagnosis not present

## 2017-04-05 DIAGNOSIS — F331 Major depressive disorder, recurrent, moderate: Secondary | ICD-10-CM | POA: Diagnosis not present

## 2017-04-11 DIAGNOSIS — F331 Major depressive disorder, recurrent, moderate: Secondary | ICD-10-CM | POA: Diagnosis not present

## 2017-04-11 DIAGNOSIS — F411 Generalized anxiety disorder: Secondary | ICD-10-CM | POA: Diagnosis not present

## 2017-04-19 DIAGNOSIS — F331 Major depressive disorder, recurrent, moderate: Secondary | ICD-10-CM | POA: Diagnosis not present

## 2017-04-26 DIAGNOSIS — F331 Major depressive disorder, recurrent, moderate: Secondary | ICD-10-CM | POA: Diagnosis not present

## 2017-04-26 DIAGNOSIS — F411 Generalized anxiety disorder: Secondary | ICD-10-CM | POA: Diagnosis not present

## 2017-05-17 DIAGNOSIS — F331 Major depressive disorder, recurrent, moderate: Secondary | ICD-10-CM | POA: Diagnosis not present

## 2017-05-17 DIAGNOSIS — F411 Generalized anxiety disorder: Secondary | ICD-10-CM | POA: Diagnosis not present

## 2017-05-31 DIAGNOSIS — F331 Major depressive disorder, recurrent, moderate: Secondary | ICD-10-CM | POA: Diagnosis not present

## 2017-06-07 DIAGNOSIS — F331 Major depressive disorder, recurrent, moderate: Secondary | ICD-10-CM | POA: Diagnosis not present

## 2017-06-07 DIAGNOSIS — F411 Generalized anxiety disorder: Secondary | ICD-10-CM | POA: Diagnosis not present

## 2017-06-28 DIAGNOSIS — F331 Major depressive disorder, recurrent, moderate: Secondary | ICD-10-CM | POA: Diagnosis not present

## 2017-06-28 DIAGNOSIS — F411 Generalized anxiety disorder: Secondary | ICD-10-CM | POA: Diagnosis not present

## 2017-07-12 DIAGNOSIS — F331 Major depressive disorder, recurrent, moderate: Secondary | ICD-10-CM | POA: Diagnosis not present

## 2017-07-19 DIAGNOSIS — F411 Generalized anxiety disorder: Secondary | ICD-10-CM | POA: Diagnosis not present

## 2017-07-19 DIAGNOSIS — F331 Major depressive disorder, recurrent, moderate: Secondary | ICD-10-CM | POA: Diagnosis not present

## 2017-08-11 DIAGNOSIS — F411 Generalized anxiety disorder: Secondary | ICD-10-CM | POA: Diagnosis not present

## 2017-08-11 DIAGNOSIS — F331 Major depressive disorder, recurrent, moderate: Secondary | ICD-10-CM | POA: Diagnosis not present

## 2017-08-17 DIAGNOSIS — Z23 Encounter for immunization: Secondary | ICD-10-CM | POA: Diagnosis not present

## 2017-09-01 DIAGNOSIS — F331 Major depressive disorder, recurrent, moderate: Secondary | ICD-10-CM | POA: Diagnosis not present

## 2017-09-01 DIAGNOSIS — F411 Generalized anxiety disorder: Secondary | ICD-10-CM | POA: Diagnosis not present

## 2017-09-22 DIAGNOSIS — F331 Major depressive disorder, recurrent, moderate: Secondary | ICD-10-CM | POA: Diagnosis not present

## 2017-09-22 DIAGNOSIS — F411 Generalized anxiety disorder: Secondary | ICD-10-CM | POA: Diagnosis not present

## 2017-10-13 DIAGNOSIS — F411 Generalized anxiety disorder: Secondary | ICD-10-CM | POA: Diagnosis not present

## 2017-10-13 DIAGNOSIS — F331 Major depressive disorder, recurrent, moderate: Secondary | ICD-10-CM | POA: Diagnosis not present

## 2017-11-03 DIAGNOSIS — F331 Major depressive disorder, recurrent, moderate: Secondary | ICD-10-CM | POA: Diagnosis not present

## 2017-11-03 DIAGNOSIS — F411 Generalized anxiety disorder: Secondary | ICD-10-CM | POA: Diagnosis not present

## 2017-11-09 DIAGNOSIS — F331 Major depressive disorder, recurrent, moderate: Secondary | ICD-10-CM | POA: Diagnosis not present

## 2017-11-17 DIAGNOSIS — F331 Major depressive disorder, recurrent, moderate: Secondary | ICD-10-CM | POA: Diagnosis not present

## 2017-11-24 DIAGNOSIS — F331 Major depressive disorder, recurrent, moderate: Secondary | ICD-10-CM | POA: Diagnosis not present

## 2017-11-24 DIAGNOSIS — F411 Generalized anxiety disorder: Secondary | ICD-10-CM | POA: Diagnosis not present

## 2017-12-15 DIAGNOSIS — F411 Generalized anxiety disorder: Secondary | ICD-10-CM | POA: Diagnosis not present

## 2017-12-15 DIAGNOSIS — F331 Major depressive disorder, recurrent, moderate: Secondary | ICD-10-CM | POA: Diagnosis not present

## 2018-01-02 DIAGNOSIS — F331 Major depressive disorder, recurrent, moderate: Secondary | ICD-10-CM | POA: Diagnosis not present

## 2018-01-05 DIAGNOSIS — F411 Generalized anxiety disorder: Secondary | ICD-10-CM | POA: Diagnosis not present

## 2018-01-05 DIAGNOSIS — F331 Major depressive disorder, recurrent, moderate: Secondary | ICD-10-CM | POA: Diagnosis not present

## 2018-01-23 DIAGNOSIS — F331 Major depressive disorder, recurrent, moderate: Secondary | ICD-10-CM | POA: Diagnosis not present

## 2018-02-01 DIAGNOSIS — F411 Generalized anxiety disorder: Secondary | ICD-10-CM | POA: Diagnosis not present

## 2018-02-01 DIAGNOSIS — F331 Major depressive disorder, recurrent, moderate: Secondary | ICD-10-CM | POA: Diagnosis not present

## 2018-02-15 DIAGNOSIS — F331 Major depressive disorder, recurrent, moderate: Secondary | ICD-10-CM | POA: Diagnosis not present

## 2018-02-21 DIAGNOSIS — F411 Generalized anxiety disorder: Secondary | ICD-10-CM | POA: Diagnosis not present

## 2018-02-21 DIAGNOSIS — F331 Major depressive disorder, recurrent, moderate: Secondary | ICD-10-CM | POA: Diagnosis not present

## 2018-03-13 DIAGNOSIS — R32 Unspecified urinary incontinence: Secondary | ICD-10-CM | POA: Diagnosis not present

## 2018-03-13 DIAGNOSIS — Z79899 Other long term (current) drug therapy: Secondary | ICD-10-CM | POA: Diagnosis not present

## 2018-03-13 DIAGNOSIS — E559 Vitamin D deficiency, unspecified: Secondary | ICD-10-CM | POA: Diagnosis not present

## 2018-03-13 DIAGNOSIS — Z1211 Encounter for screening for malignant neoplasm of colon: Secondary | ICD-10-CM | POA: Diagnosis not present

## 2018-03-13 DIAGNOSIS — F322 Major depressive disorder, single episode, severe without psychotic features: Secondary | ICD-10-CM | POA: Diagnosis not present

## 2018-03-13 DIAGNOSIS — Z0000 Encounter for general adult medical examination without abnormal findings: Secondary | ICD-10-CM | POA: Diagnosis not present

## 2018-03-13 DIAGNOSIS — I10 Essential (primary) hypertension: Secondary | ICD-10-CM | POA: Diagnosis not present

## 2018-03-13 DIAGNOSIS — D692 Other nonthrombocytopenic purpura: Secondary | ICD-10-CM | POA: Diagnosis not present

## 2018-03-13 DIAGNOSIS — E7800 Pure hypercholesterolemia, unspecified: Secondary | ICD-10-CM | POA: Diagnosis not present

## 2018-03-13 DIAGNOSIS — I4892 Unspecified atrial flutter: Secondary | ICD-10-CM | POA: Diagnosis not present

## 2018-03-14 DIAGNOSIS — F331 Major depressive disorder, recurrent, moderate: Secondary | ICD-10-CM | POA: Diagnosis not present

## 2018-03-14 DIAGNOSIS — F411 Generalized anxiety disorder: Secondary | ICD-10-CM | POA: Diagnosis not present

## 2018-03-22 DIAGNOSIS — F331 Major depressive disorder, recurrent, moderate: Secondary | ICD-10-CM | POA: Diagnosis not present

## 2018-04-04 DIAGNOSIS — F411 Generalized anxiety disorder: Secondary | ICD-10-CM | POA: Diagnosis not present

## 2018-04-04 DIAGNOSIS — F331 Major depressive disorder, recurrent, moderate: Secondary | ICD-10-CM | POA: Diagnosis not present

## 2018-04-25 DIAGNOSIS — F411 Generalized anxiety disorder: Secondary | ICD-10-CM | POA: Diagnosis not present

## 2018-04-25 DIAGNOSIS — F331 Major depressive disorder, recurrent, moderate: Secondary | ICD-10-CM | POA: Diagnosis not present

## 2018-05-16 DIAGNOSIS — F411 Generalized anxiety disorder: Secondary | ICD-10-CM | POA: Diagnosis not present

## 2018-05-16 DIAGNOSIS — F331 Major depressive disorder, recurrent, moderate: Secondary | ICD-10-CM | POA: Diagnosis not present

## 2018-05-17 DIAGNOSIS — F331 Major depressive disorder, recurrent, moderate: Secondary | ICD-10-CM | POA: Diagnosis not present

## 2018-05-17 DIAGNOSIS — Z23 Encounter for immunization: Secondary | ICD-10-CM | POA: Diagnosis not present

## 2018-06-06 DIAGNOSIS — F411 Generalized anxiety disorder: Secondary | ICD-10-CM | POA: Diagnosis not present

## 2018-06-06 DIAGNOSIS — F331 Major depressive disorder, recurrent, moderate: Secondary | ICD-10-CM | POA: Diagnosis not present

## 2018-07-04 DIAGNOSIS — F411 Generalized anxiety disorder: Secondary | ICD-10-CM | POA: Diagnosis not present

## 2018-07-04 DIAGNOSIS — F331 Major depressive disorder, recurrent, moderate: Secondary | ICD-10-CM | POA: Diagnosis not present

## 2018-07-28 DIAGNOSIS — F331 Major depressive disorder, recurrent, moderate: Secondary | ICD-10-CM | POA: Diagnosis not present

## 2018-07-28 DIAGNOSIS — F411 Generalized anxiety disorder: Secondary | ICD-10-CM | POA: Diagnosis not present

## 2018-08-24 DIAGNOSIS — F411 Generalized anxiety disorder: Secondary | ICD-10-CM | POA: Diagnosis not present

## 2018-08-24 DIAGNOSIS — F331 Major depressive disorder, recurrent, moderate: Secondary | ICD-10-CM | POA: Diagnosis not present

## 2018-09-04 DIAGNOSIS — F331 Major depressive disorder, recurrent, moderate: Secondary | ICD-10-CM | POA: Diagnosis not present

## 2018-09-14 DIAGNOSIS — F331 Major depressive disorder, recurrent, moderate: Secondary | ICD-10-CM | POA: Diagnosis not present

## 2018-09-14 DIAGNOSIS — F411 Generalized anxiety disorder: Secondary | ICD-10-CM | POA: Diagnosis not present

## 2018-10-06 ENCOUNTER — Other Ambulatory Visit: Payer: Self-pay | Admitting: Family Medicine

## 2018-10-06 DIAGNOSIS — E2839 Other primary ovarian failure: Secondary | ICD-10-CM

## 2018-10-06 DIAGNOSIS — Z1231 Encounter for screening mammogram for malignant neoplasm of breast: Secondary | ICD-10-CM

## 2018-10-09 DIAGNOSIS — F331 Major depressive disorder, recurrent, moderate: Secondary | ICD-10-CM | POA: Diagnosis not present

## 2018-12-04 DIAGNOSIS — F331 Major depressive disorder, recurrent, moderate: Secondary | ICD-10-CM | POA: Diagnosis not present

## 2018-12-12 DIAGNOSIS — F331 Major depressive disorder, recurrent, moderate: Secondary | ICD-10-CM | POA: Diagnosis not present

## 2018-12-12 DIAGNOSIS — F411 Generalized anxiety disorder: Secondary | ICD-10-CM | POA: Diagnosis not present

## 2018-12-13 ENCOUNTER — Other Ambulatory Visit: Payer: Medicare Other

## 2018-12-13 ENCOUNTER — Ambulatory Visit: Payer: Medicare Other

## 2019-02-07 ENCOUNTER — Other Ambulatory Visit: Payer: Self-pay

## 2019-02-07 ENCOUNTER — Ambulatory Visit
Admission: RE | Admit: 2019-02-07 | Discharge: 2019-02-07 | Disposition: A | Discharge status: Home / Self Care | Payer: Medicare Other | Source: Ambulatory Visit | Attending: Family Medicine | Admitting: Family Medicine

## 2019-02-07 DIAGNOSIS — Z1231 Encounter for screening mammogram for malignant neoplasm of breast: Secondary | ICD-10-CM | POA: Diagnosis not present

## 2019-02-07 DIAGNOSIS — Z780 Asymptomatic menopausal state: Secondary | ICD-10-CM | POA: Diagnosis not present

## 2019-02-07 DIAGNOSIS — E2839 Other primary ovarian failure: Secondary | ICD-10-CM

## 2019-02-07 DIAGNOSIS — M810 Age-related osteoporosis without current pathological fracture: Secondary | ICD-10-CM | POA: Diagnosis not present

## 2019-02-08 ENCOUNTER — Other Ambulatory Visit: Payer: Self-pay | Admitting: Family Medicine

## 2019-02-08 DIAGNOSIS — R928 Other abnormal and inconclusive findings on diagnostic imaging of breast: Secondary | ICD-10-CM

## 2019-02-15 ENCOUNTER — Ambulatory Visit
Admission: RE | Admit: 2019-02-15 | Discharge: 2019-02-15 | Disposition: A | Discharge status: Home / Self Care | Payer: Medicare Other | Source: Ambulatory Visit | Attending: Family Medicine | Admitting: Family Medicine

## 2019-02-15 ENCOUNTER — Ambulatory Visit: Payer: Medicare Other

## 2019-02-15 ENCOUNTER — Other Ambulatory Visit: Payer: Self-pay

## 2019-02-15 DIAGNOSIS — R928 Other abnormal and inconclusive findings on diagnostic imaging of breast: Secondary | ICD-10-CM | POA: Diagnosis not present

## 2019-03-05 DIAGNOSIS — F331 Major depressive disorder, recurrent, moderate: Secondary | ICD-10-CM | POA: Diagnosis not present

## 2019-03-27 DIAGNOSIS — F331 Major depressive disorder, recurrent, moderate: Secondary | ICD-10-CM | POA: Diagnosis not present

## 2019-03-27 DIAGNOSIS — F411 Generalized anxiety disorder: Secondary | ICD-10-CM | POA: Diagnosis not present

## 2019-04-17 DIAGNOSIS — F411 Generalized anxiety disorder: Secondary | ICD-10-CM | POA: Diagnosis not present

## 2019-04-17 DIAGNOSIS — F331 Major depressive disorder, recurrent, moderate: Secondary | ICD-10-CM | POA: Diagnosis not present

## 2019-04-18 DIAGNOSIS — Z23 Encounter for immunization: Secondary | ICD-10-CM | POA: Diagnosis not present

## 2019-04-19 DIAGNOSIS — S40029A Contusion of unspecified upper arm, initial encounter: Secondary | ICD-10-CM | POA: Diagnosis not present

## 2019-04-19 DIAGNOSIS — E7800 Pure hypercholesterolemia, unspecified: Secondary | ICD-10-CM | POA: Diagnosis not present

## 2019-04-19 DIAGNOSIS — F322 Major depressive disorder, single episode, severe without psychotic features: Secondary | ICD-10-CM | POA: Diagnosis not present

## 2019-04-19 DIAGNOSIS — M1990 Unspecified osteoarthritis, unspecified site: Secondary | ICD-10-CM | POA: Diagnosis not present

## 2019-04-19 DIAGNOSIS — D692 Other nonthrombocytopenic purpura: Secondary | ICD-10-CM | POA: Diagnosis not present

## 2019-04-19 DIAGNOSIS — E559 Vitamin D deficiency, unspecified: Secondary | ICD-10-CM | POA: Diagnosis not present

## 2019-04-19 DIAGNOSIS — I4892 Unspecified atrial flutter: Secondary | ICD-10-CM | POA: Diagnosis not present

## 2019-04-19 DIAGNOSIS — Z0000 Encounter for general adult medical examination without abnormal findings: Secondary | ICD-10-CM | POA: Diagnosis not present

## 2019-04-19 DIAGNOSIS — Z79899 Other long term (current) drug therapy: Secondary | ICD-10-CM | POA: Diagnosis not present

## 2019-04-19 DIAGNOSIS — W19XXXA Unspecified fall, initial encounter: Secondary | ICD-10-CM | POA: Diagnosis not present

## 2019-04-19 DIAGNOSIS — I10 Essential (primary) hypertension: Secondary | ICD-10-CM | POA: Diagnosis not present

## 2019-04-19 DIAGNOSIS — G250 Essential tremor: Secondary | ICD-10-CM | POA: Diagnosis not present

## 2019-05-01 ENCOUNTER — Ambulatory Visit: Payer: Medicare Other | Attending: Family Medicine

## 2019-05-01 ENCOUNTER — Other Ambulatory Visit: Payer: Self-pay

## 2019-05-01 DIAGNOSIS — M6281 Muscle weakness (generalized): Secondary | ICD-10-CM | POA: Insufficient documentation

## 2019-05-01 DIAGNOSIS — R2689 Other abnormalities of gait and mobility: Secondary | ICD-10-CM | POA: Insufficient documentation

## 2019-05-01 NOTE — Therapy (Signed)
[facility] [address] [place], [place], [address] Phone: [phone]   Fax:  [phone]  Physical Therapy Evaluation  Patient Details  Name: Kathryn Snyder MRN: [id number] Date of Birth: Mar 30, 1941 Referring Provider (PT): Jonathon Jordan, MD   Encounter Date: 05/01/2019  PT End of Session - 05/01/19 1248    Visit Number  1    Date for PT Re-Evaluation  06/26/19    Authorization Type  Medicare    PT Start Time  1215    PT Stop Time  1250    PT Time Calculation (min)  35 min    Activity Tolerance  Patient tolerated treatment well    Behavior During Therapy  [facility] for tasks assessed/performed       Past Medical History:  Diagnosis Date  . Anxiety   . Cancer ([place]) 1983   melanoma on back  . Depression   . Hypertension     History reviewed. No pertinent surgical history.  There were no vitals filed for this visit.   Subjective Assessment - 05/01/19 1220    Subjective  Pt presents to PT with progressive weakness and balance deficits due to inactivity.  Pt reports that she is not as active as she used to be.  Pt had had falls over the past 3 years.  The most recent was in July and she hurt her Lt wrist.    Pertinent History  osteoporosis    Limitations  Walking    How long can you walk comfortably?  10 minutes    Patient Stated Goals  improve balance, strength and endurance    Currently in Pain?  No/denies         [facility] PT Assessment - 05/01/19 0001      Assessment   Medical Diagnosis  balance and strength training, accidental fall    Referring Provider (PT)  Jonathon Jordan, MD    Onset Date/Surgical Date  11/29/18    Prior Therapy  none      Precautions   Precautions  Fall      Restrictions   Weight Bearing Restrictions  No      Balance Screen   Has the patient fallen in the past 6 months  Yes    How many times?  1   PT is here for balance   Has the patient had a decrease in activity level  because of a fear of falling?   Yes    Is the patient reluctant to leave their home because of a fear of falling?   Yes      Home Environment   Living Environment  Assisted living    Home Equipment  None    Additional Comments  [place]       Prior Function   Level of Independence  Independent    Vocation  Retired    Leisure  none- not active and wants to be more active      Cognition   Overall Cognitive Status  Within Functional Limits for tasks assessed      Posture/Postural Control   Posture/Postural Control  Postural limitations    Postural Limitations  Forward head;Rounded Shoulders      ROM / Strength   AROM / PROM / Strength  AROM;Strength      AROM   Overall AROM   Deficits    Overall AROM Comments  A/ROM is WFLS.  Lt shoulder is limited to < 90 degrees flexion due to severe  OA per pt report      Strength   Overall Strength  Deficits    Overall Strength Comments  Bil knees 4/5, hips 4-/5, Lt shoulder not texted due to OA, Rt UE 4-5      Transfers   Transfers  Sit to Stand;Stand to Sit    Sit to Stand  Without upper extremity assist    Five time sit to stand comments   14.15 seconds    Stand to Sit  With upper extremity assist      Ambulation/Gait   Ambulation/Gait  Yes    Ambulation/Gait Assistance  6: Modified independent (Device/Increase time)    Gait Pattern  Step-through pattern;Decreased dorsiflexion - right;Decreased dorsiflexion - left;Decreased stride length                Objective measurements completed on examination: See above findings.              PT Education - 05/01/19 1246    Education Details  Access Code: [email or web address]    Person(s) Educated  Patient    Methods  Explanation;Demonstration;Handout    Comprehension  Verbalized understanding;Returned demonstration       PT Short Term Goals - 05/01/19 1219      PT SHORT TERM GOAL #1   Title  be independent in initial HEP    Time  4    Period  Weeks    Status  New     Target Date  05/29/19      PT SHORT TERM GOAL #2   Title  report regular walking during the day 5-10 minutes 3x/day to improve mobilty and endurance    Time  4    Period  Weeks    Status  New    Target Date  06/26/19      PT SHORT TERM GOAL #3   Title  perform 5x sit to stand in < or = to 12 seconds to improve balance    Time  4    Period  Weeks    Status  New    Target Date  05/29/19        PT Long Term Goals - 05/01/19 1219      PT LONG TERM GOAL #1   Title  be independent in advanced HEP    Time  8    Period  Weeks    Status  New    Target Date  06/26/19      PT LONG TERM GOAL #2   Title  perform 5x sit to stand in < or = to 10 seconds to improve balance    Time  8    Period  Weeks    Status  New    Target Date  06/26/19      PT LONG TERM GOAL #3   Title  report compliance with daily exercise and walking for endurance progression    Time  8    Period  Weeks    Status  New    Target Date  06/26/19      PT LONG TERM GOAL #4   Title  demonstrate 4 to 4+/5 bilateral LE strength to improve safety and endurance in the community    Time  8    Period  Weeks    Status  New    Target Date  06/26/19             Plan - 05/01/19 1304    Clinical Impression Statement  Pt reports to PT with complaint of progressive weakness and intermittent falls.  Pt lives at [facility] independent living and reports that she sits in her room and is no longer active.  Pt had 1 fall in July when she caught her toe on the carpet.  Pt demonstrates shortened step length and decreased DF bilaterally on level surface.  She negotiates step with step-to gait due to Lt knee pain reported.  Pt with limited Lt shoulder A/ROM and strength due to significant OA (per pt report).  Pt with 4- to 4/5 UE and LE strength.  Pt performed 5x sit to stand in 14.15 seconds indicating a falls risk.  Pt will benefit from skilled PT to address strength, balance and endurance to reduce falls risk and improve safety.     Personal Factors and Comorbidities  Age;Comorbidity 1;Fitness    Comorbidities  Lt knee pain, Lt shoulder OA, immobile    Examination-Activity Limitations  Locomotion Level;Stand;Stairs;Squat    Examination-Participation Restrictions  Community Activity;Meal Prep    Stability/Clinical Decision Making  Evolving/Moderate complexity    Clinical Decision Making  Moderate    Rehab Potential  Good    PT Frequency  2x / week    PT Duration  8 weeks    PT Treatment/Interventions  ADLs/Self Care Home Management;Gait training;Stair training;Functional mobility training;Therapeutic activities;Therapeutic exercise;Balance training;Neuromuscular re-education;Manual techniques;Patient/family education;Passive range of motion    PT Next Visit Plan  balance, gait, endurance, strength    PT Home Exercise Plan  Access Code: [email or web address]    Consulted and Agree with Plan of Care  Patient       Patient will benefit from skilled therapeutic intervention in order to improve the following deficits and impairments:  Abnormal gait, Decreased activity tolerance, Decreased balance, Decreased endurance, Decreased strength, Decreased safety awareness  Visit Diagnosis: Muscle weakness (generalized) - Plan: PT plan of care cert/re-cert  Other abnormalities of gait and mobility - Plan: PT plan of care cert/re-cert     Problem List There are no active problems to display for this patient.   Jazmon Kos 05/01/2019, 1:10 PM  [facility] [address] [place], [place], [address] Phone: [phone]   Fax:  [phone]  Name: Breelle Schorn MRN: [id number] Date of Birth: Jul 03, 1941

## 2019-05-01 NOTE — Patient Instructions (Signed)
Access Code: [email or web address]  URL: [email or web address]  Date: 05/01/2019  Prepared by: Sigurd Sos   Exercises Seated Long Arc Quad - 10 reps - 2 sets - 5 hold - 3x daily - 7x weekly Seated March - 10 reps - 3 sets - 3x daily - 7x weekly Seated Heel Toe Raises - 10 reps - 2 sets - 3x daily - 7x weekly Seated Heel Raise - 10 reps - 2 sets - 3x daily - 7x weekly Sit to Stand - 15 reps - 2 sets - 3x daily - 7x weekly

## 2019-05-04 ENCOUNTER — Ambulatory Visit: Payer: Medicare Other | Admitting: Physical Therapy

## 2019-05-04 ENCOUNTER — Other Ambulatory Visit: Payer: Self-pay

## 2019-05-04 ENCOUNTER — Encounter: Payer: Self-pay | Admitting: Physical Therapy

## 2019-05-04 DIAGNOSIS — R2689 Other abnormalities of gait and mobility: Secondary | ICD-10-CM | POA: Diagnosis not present

## 2019-05-04 DIAGNOSIS — M6281 Muscle weakness (generalized): Secondary | ICD-10-CM

## 2019-05-04 NOTE — Therapy (Signed)
[facility] [address] [place], [place], [address] Phone: [phone]   Fax:  [phone]  Physical Therapy Treatment  Patient Details  Name: Kathryn Snyder MRN: [id number] Date of Birth: Apr 10, 1941 Referring Provider (PT): Jonathon Jordan, MD   Encounter Date: 05/04/2019  PT End of Session - 05/04/19 1108    Visit Number  2    Date for PT Re-Evaluation  06/26/19    Authorization Type  Medicare    PT Start Time  1028    PT Stop Time  1106    PT Time Calculation (min)  38 min    Activity Tolerance  Patient tolerated treatment well       Past Medical History:  Diagnosis Date  . Anxiety   . Cancer ([place]) 1983   melanoma on back  . Depression   . Hypertension     History reviewed. No pertinent surgical history.  There were no vitals filed for this visit.  Subjective Assessment - 05/04/19 1034    Subjective  The hardest exercise is the sit to stand exercise.   I should have been walking more at home.     Pertinent History  osteoporosis;  left shoulder rotator cuff tear;  "arthritis everywhere"    Currently in Pain?  No/denies                       [facility] Adult PT Treatment/Exercise - 05/04/19 0001      Therapeutic Activites    ADL's  sit to stand       Knee/Hip Exercises: Aerobic   Nustep  L 1 5 min    not using left arm secondary to shoulder pain     Knee/Hip Exercises: Seated   Long Arc Quad  AROM;Right;Left;10 reps    Ball Squeeze  10x   5 sec hold   Clamshell with TheraBand  Red   20x    Other Seated Knee/Hip Exercises  heel/toes 10x     Other Seated Knee/Hip Exercises  eccentric "chair sit ups" for abdominals 10x     Marching  AROM;Right;Left;10 reps    Sit to Sand  1 set;10 reps;without UE support   mat table             PT Education - 05/04/19 1116    Education Details  Access Code: [email or web address]  ball squeeze, red band seated clams; chair abdominals    Person(s) Educated   Patient    Methods  Explanation;Demonstration;Handout    Comprehension  Returned demonstration;Verbalized understanding       PT Short Term Goals - 05/01/19 1219      PT SHORT TERM GOAL #1   Title  be independent in initial HEP    Time  4    Period  Weeks    Status  New    Target Date  05/29/19      PT SHORT TERM GOAL #2   Title  report regular walking during the day 5-10 minutes 3x/day to improve mobilty and endurance    Time  4    Period  Weeks    Status  New    Target Date  06/26/19      PT SHORT TERM GOAL #3   Title  perform 5x sit to stand in < or = to 12 seconds to improve balance    Time  4    Period  Weeks    Status  New  Target Date  05/29/19        PT Long Term Goals - 05/01/19 1219      PT LONG TERM GOAL #1   Title  be independent in advanced HEP    Time  8    Period  Weeks    Status  New    Target Date  06/26/19      PT LONG TERM GOAL #2   Title  perform 5x sit to stand in < or = to 10 seconds to improve balance    Time  8    Period  Weeks    Status  New    Target Date  06/26/19      PT LONG TERM GOAL #3   Title  report compliance with daily exercise and walking for endurance progression    Time  8    Period  Weeks    Status  New    Target Date  06/26/19      PT LONG TERM GOAL #4   Title  demonstrate 4 to 4+/5 bilateral LE strength to improve safety and endurance in the community    Time  8    Period  Weeks    Status  New    Target Date  06/26/19            Plan - 05/04/19 1051    Clinical Impression Statement  The patient needs moderate verbal cues with review of initial HEP and modification with sit to stand exercise with hips much higher than knees.  She needs cues for "nose over knees" with sit to stand.  Some left knee crepitus with sit to stand but no increase in pain.  Discontinued use of left UE on Nu-Step secondary to shoulder pain.  Anticipate a slower progression secondary to multi joint arthritis and deconditioned  status.  Therapist closely monitoring response with all interventions.    Comorbidities  Lt knee pain, Lt shoulder OA, immobile    Examination-Participation Restrictions  Community Activity;Meal Prep    Stability/Clinical Decision Making  Evolving/Moderate complexity    Rehab Potential  Good    PT Duration  8 weeks    PT Treatment/Interventions  ADLs/Self Care Home Management;Gait training;Stair training;Functional mobility training;Therapeutic activities;Therapeutic exercise;Balance training;Neuromuscular re-education;Manual techniques;Patient/family education;Passive range of motion    PT Next Visit Plan  start standing balance ex, gait, endurance, strength; lower level initially    PT Home Exercise Plan  Access Code: [email or web address]       Patient will benefit from skilled therapeutic intervention in order to improve the following deficits and impairments:  Abnormal gait, Decreased activity tolerance, Decreased balance, Decreased endurance, Decreased strength, Decreased safety awareness  Visit Diagnosis: Muscle weakness (generalized)  Other abnormalities of gait and mobility     Problem List There are no active problems to display for this patient. Ruben Im, PT 05/04/19 11:18 AM Phone: [phone] Fax: [phone] Alvera Singh 05/04/2019, 11:17 AM  [facility] [address] [place], [place], [address] Phone: [phone]   Fax:  [phone]  Name: Kathryn Snyder MRN: [id number] Date of Birth: 29-Nov-1940

## 2019-05-04 NOTE — Patient Instructions (Signed)
Access Code: [email or web address]  URL: [email or web address]  Date: 05/04/2019  Prepared by: Ruben Im   Exercises  Seated Long Arc Quad - 10 reps - 2 sets - 5 hold - 3x daily - 7x weekly  Seated March - 10 reps - 3 sets - 3x daily - 7x weekly  Seated Heel Toe Raises - 10 reps - 2 sets - 3x daily - 7x weekly  Seated Heel Raise - 10 reps - 2 sets - 3x daily - 7x weekly  Sit to Stand - 15 reps - 2 sets - 3x daily - 7x weekly  Seated Hip Adduction Squeeze with Ball - 15 reps - 1 sets - 5 hold - 1x daily - 7x weekly  Seated Hip Abduction with Resistance - 10 reps - 1 sets - 1x daily - 7x weekly  Seated Eccentric Abdominal Lean Back - 10 reps - 1 sets - 1x daily - 7x weekly

## 2019-05-08 ENCOUNTER — Other Ambulatory Visit: Payer: Self-pay

## 2019-05-08 ENCOUNTER — Encounter: Payer: Self-pay | Admitting: Physical Therapy

## 2019-05-08 ENCOUNTER — Ambulatory Visit: Payer: Medicare Other | Admitting: Physical Therapy

## 2019-05-08 DIAGNOSIS — R2689 Other abnormalities of gait and mobility: Secondary | ICD-10-CM | POA: Diagnosis not present

## 2019-05-08 DIAGNOSIS — F411 Generalized anxiety disorder: Secondary | ICD-10-CM | POA: Diagnosis not present

## 2019-05-08 DIAGNOSIS — M6281 Muscle weakness (generalized): Secondary | ICD-10-CM | POA: Diagnosis not present

## 2019-05-08 DIAGNOSIS — F331 Major depressive disorder, recurrent, moderate: Secondary | ICD-10-CM | POA: Diagnosis not present

## 2019-05-08 NOTE — Therapy (Signed)
[facility] [address], [place], [place], [address] Phone: [phone]   Fax:  [phone]  Physical Therapy Treatment  Patient Details  Name: Kathryn Snyder MRN: [id number] Date of Birth: 05/05/1941 Referring Provider (PT): Jonathon Jordan, MD   Encounter Date: 05/08/2019  PT End of Session - 05/08/19 1531    Visit Number  3    Date for PT Re-Evaluation  06/26/19    Authorization Type  Medicare    PT Start Time  1525    PT Stop Time  1558   pt late   PT Time Calculation (min)  33 min    Activity Tolerance  Patient tolerated treatment well       Past Medical History:  Diagnosis Date  . Anxiety   . Cancer ([place]) 1983   melanoma on back  . Depression   . Hypertension     History reviewed. No pertinent surgical history.  There were no vitals filed for this visit.  Subjective Assessment - 05/08/19 1527    Subjective  Did fine after time.  I should walk more.  The sit to stand exercise is hard but I've gotten better at it.  I added a pillow to the seat of the chair.    Pertinent History  osteoporosis;  left shoulder rotator cuff tear;  "arthritis everywhere"    Currently in Pain?  No/denies                       [facility] Adult PT Treatment/Exercise - 05/08/19 0001      Therapeutic Activites    ADL's  sit to stand; walking, standing       Neuro Re-ed    Neuro Re-ed Details    standing on black foarm with weight shift front to back in staggered stand and side to side weight shift, marching 1 minute each    on/off UE support needed     Knee/Hip Exercises: Aerobic   Nustep  L 1 7 min    not using left arm secondary to shoulder pain     Knee/Hip Exercises: Standing   Heel Raises  Both;10 reps    Hip Abduction  AROM;Right;Left;10 reps    Hip Extension  AROM;Right;Left;10 reps    Forward Step Up  Left;10 reps;Hand Hold: 2;Step Height: 2";Step Height: 4"    Forward Step Up Limitations  10 reps on  2 inch, 5 reps with 4 inch     Other Standing Knee Exercises  step taps 1 minute     Other Standing Knee Exercises  sidestepping 1 minute       Knee/Hip Exercises: Seated   Clamshell with TheraBand  Red   20x    Other Seated Knee/Hip Exercises  eccentric "chair sit ups" for abdominals 10x     Sit to Sand  1 set;10 reps;without UE support   mat table               PT Short Term Goals - 05/01/19 1219      PT SHORT TERM GOAL #1   Title  be independent in initial HEP    Time  4    Period  Weeks    Status  New    Target Date  05/29/19      PT SHORT TERM GOAL #2   Title  report regular walking during the day 5-10 minutes 3x/day to improve mobilty and endurance    Time  4  Period  Weeks    Status  New    Target Date  06/26/19      PT SHORT TERM GOAL #3   Title  perform 5x sit to stand in < or = to 12 seconds to improve balance    Time  4    Period  Weeks    Status  New    Target Date  05/29/19        PT Long Term Goals - 05/01/19 1219      PT LONG TERM GOAL #1   Title  be independent in advanced HEP    Time  8    Period  Weeks    Status  New    Target Date  06/26/19      PT LONG TERM GOAL #2   Title  perform 5x sit to stand in < or = to 10 seconds to improve balance    Time  8    Period  Weeks    Status  New    Target Date  06/26/19      PT LONG TERM GOAL #3   Title  report compliance with daily exercise and walking for endurance progression    Time  8    Period  Weeks    Status  New    Target Date  06/26/19      PT LONG TERM GOAL #4   Title  demonstrate 4 to 4+/5 bilateral LE strength to improve safety and endurance in the community    Time  8    Period  Weeks    Status  New    Target Date  06/26/19            Plan - 05/08/19 1726    Clinical Impression Statement  The patient requests a review of sit to stand and seated clams with band.  Made modifications to her HEP with elevated chair height secondary to knee pain with sit to stand  exercise and red band lightly looped around thighs secondary lack of hand dexterity to tie.  No major loss of balance requiring therapist assist but she does need intermittent UE use to stabilize.  Therapist closely monitoring response with all and modifying as needed.  No additional ex's added today to HEP as patient seems a bit overwhelmed with her current program.    Comorbidities  Lt knee pain, Lt shoulder OA, immobile    Examination-Activity Limitations  Locomotion Level;Stand;Stairs;Squat    Examination-Participation Restrictions  Community Activity;Meal Prep    Rehab Potential  Good    PT Frequency  2x / week    PT Duration  8 weeks    PT Treatment/Interventions  ADLs/Self Care Home Management;Gait training;Stair training;Functional mobility training;Therapeutic activities;Therapeutic exercise;Balance training;Neuromuscular re-education;Manual techniques;Patient/family education;Passive range of motion    PT Next Visit Plan  balance ex, gait, endurance, strength; lower level initially;  add standing ex's to HEP    PT Home Exercise Plan  Access Code: [email or web address]       Patient will benefit from skilled therapeutic intervention in order to improve the following deficits and impairments:  Abnormal gait, Decreased activity tolerance, Decreased balance, Decreased endurance, Decreased strength, Decreased safety awareness  Visit Diagnosis: Muscle weakness (generalized)  Other abnormalities of gait and mobility     Problem List There are no active problems to display for this patient.  Ruben Im, PT 05/08/19 5:37 PM Phone: [phone] Fax: [phone] Alvera Singh 05/08/2019, 5:36 PM  [facility] [address] [place], [place], [address] Phone: [phone]   Fax:  [phone]  Name: Melvin Primeaux MRN: [id number] Date of Birth: 30-Sep-1940

## 2019-05-14 ENCOUNTER — Other Ambulatory Visit: Payer: Self-pay

## 2019-05-14 ENCOUNTER — Ambulatory Visit: Payer: Medicare Other

## 2019-05-14 DIAGNOSIS — M6281 Muscle weakness (generalized): Secondary | ICD-10-CM

## 2019-05-14 DIAGNOSIS — R2689 Other abnormalities of gait and mobility: Secondary | ICD-10-CM

## 2019-05-14 NOTE — Therapy (Signed)
[facility] [address] [place], [place], [address] Phone: [phone]   Fax:  [phone]  Physical Therapy Treatment  Patient Details  Name: Kathryn Snyder MRN: [id number] Date of Birth: 02-10-1941 Referring Provider (PT): Jonathon Jordan, MD   Encounter Date: 05/14/2019  PT End of Session - 05/14/19 1055    Visit Number  4    Date for PT Re-Evaluation  06/26/19    Authorization Type  Medicare    PT Start Time  1012    PT Stop Time  1056    PT Time Calculation (min)  44 min    Activity Tolerance  Patient tolerated treatment well    Behavior During Therapy  [facility] for tasks assessed/performed       Past Medical History:  Diagnosis Date  . Anxiety   . Cancer ([place]) 1983   melanoma on back  . Depression   . Hypertension     History reviewed. No pertinent surgical history.  There were no vitals filed for this visit.  Subjective Assessment - 05/14/19 1020    Subjective  I am working on getting better at sit to stand.    Pertinent History  osteoporosis;  left shoulder rotator cuff tear;  "arthritis everywhere"    Patient Stated Goals  improve balance, strength and endurance    Currently in Pain?  No/denies         [facility] PT Assessment - 05/14/19 0001      Transfers   Five time sit to stand comments   16.58   without hands                  [facility] Adult PT Treatment/Exercise - 05/14/19 0001      Therapeutic Activites    ADL's  sit to stand; walking, standing       Neuro Re-ed    Neuro Re-ed Details    standing on black foarm with weight shift front to back in staggered stand and side to side weight shift, marching 1 minute each    on/off UE support needed     Knee/Hip Exercises: Aerobic   Nustep  L 1  8 min    PT present to discuss progress     Knee/Hip Exercises: Standing   Heel Raises  Both;10 reps;2 sets    Hip Abduction  AROM;Right;Left;10 reps;2 sets    Hip Extension   AROM;Right;Left;10 reps;2 sets    Other Standing Knee Exercises  step taps 1 minute       Knee/Hip Exercises: Seated   Clamshell with TheraBand  Red   20x    Marching  AROM;Right;Left;10 reps    Sit to Sand  1 set;10 reps;without UE support   mat table               PT Short Term Goals - 05/14/19 1020      PT SHORT TERM GOAL #1   Title  be independent in initial HEP    Status  Achieved      PT SHORT TERM GOAL #2   Title  report regular walking during the day 5-10 minutes 3x/day to improve mobilty and endurance    Baseline  walking 2x/day for 10 minutes    Time  4    Period  Weeks    Status  On-going      PT SHORT TERM GOAL #3   Title  perform 5x sit to stand in < or = to 12 seconds  to improve balance    Baseline  16.5 seconds    Time  4    Period  Weeks    Status  On-going        PT Long Term Goals - 05/01/19 1219      PT LONG TERM GOAL #1   Title  be independent in advanced HEP    Time  8    Period  Weeks    Status  New    Target Date  06/26/19      PT LONG TERM GOAL #2   Title  perform 5x sit to stand in < or = to 10 seconds to improve balance    Time  8    Period  Weeks    Status  New    Target Date  06/26/19      PT LONG TERM GOAL #3   Title  report compliance with daily exercise and walking for endurance progression    Time  8    Period  Weeks    Status  New    Target Date  06/26/19      PT LONG TERM GOAL #4   Title  demonstrate 4 to 4+/5 bilateral LE strength to improve safety and endurance in the community    Time  8    Period  Weeks    Status  New    Target Date  06/26/19            Plan - 05/14/19 1035    Clinical Impression Statement  Pt is becoming more active at home and reports walking 2x/day in the halls for 10 minutes each.  Pt is working on sit to stand exercise to improve her overall stability.  Pt requires stand by assistance and verbal cueing for safety with standing exercise today.  Pt will continue to benefit from  skilled PT for strength, endurance and mobility progression.    PT Frequency  2x / week    PT Duration  8 weeks    PT Treatment/Interventions  ADLs/Self Care Home Management;Gait training;Stair training;Functional mobility training;Therapeutic activities;Therapeutic exercise;Balance training;Neuromuscular re-education;Manual techniques;Patient/family education;Passive range of motion    PT Next Visit Plan  balance ex, gait, endurance, strength; lower level initially;  add standing ex's to HEP as pt is ready for new exercises    PT Home Exercise Plan  Access Code: [email or web address]    Recommended Other Services  initial certification is signed.    Consulted and Agree with Plan of Care  Patient       Patient will benefit from skilled therapeutic intervention in order to improve the following deficits and impairments:  Abnormal gait, Decreased activity tolerance, Decreased balance, Decreased endurance, Decreased strength, Decreased safety awareness  Visit Diagnosis: Muscle weakness (generalized)  Other abnormalities of gait and mobility     Problem List There are no active problems to display for this patient.    Sigurd Sos, PT 05/14/19 10:59 AM  [facility] [address] [place], [place], [address] Phone: [phone]   Fax:  [phone]  Name: Aarionna Azizi MRN: [id number] Date of Birth: 1941-02-06

## 2019-05-21 ENCOUNTER — Ambulatory Visit: Payer: Medicare Other | Attending: Family Medicine

## 2019-05-21 ENCOUNTER — Other Ambulatory Visit: Payer: Self-pay

## 2019-05-21 DIAGNOSIS — R2689 Other abnormalities of gait and mobility: Secondary | ICD-10-CM

## 2019-05-21 DIAGNOSIS — M6281 Muscle weakness (generalized): Secondary | ICD-10-CM | POA: Insufficient documentation

## 2019-05-21 NOTE — Patient Instructions (Signed)
Access Code: [email or web address]  URL: [email or web address]  Date: 05/21/2019  Prepared by: Sigurd Sos   Exercises  Standing Hip Abduction - 10 reps - 2 sets - 2x daily - 7x weekly Standing Hip Extension - 10 reps - 2 sets - 2x daily - 7x weekly

## 2019-05-21 NOTE — Therapy (Signed)
[facility] [address], [place], [place], [address] Phone: [phone]   Fax:  [phone]  Physical Therapy Treatment  Patient Details  Name: Kathryn Snyder MRN: [id number] Date of Birth: 1941-06-08 Referring Provider (PT): Jonathon Jordan, MD   Encounter Date: 05/21/2019  PT End of Session - 05/21/19 1223    Visit Number  5    Date for PT Re-Evaluation  06/26/19    Authorization Type  Medicare    PT Start Time  1142    PT Stop Time  1221    PT Time Calculation (min)  39 min    Activity Tolerance  Patient tolerated treatment well    Behavior During Therapy  [facility] for tasks assessed/performed       Past Medical History:  Diagnosis Date  . Anxiety   . Cancer ([place]) 1983   melanoma on back  . Depression   . Hypertension     History reviewed. No pertinent surgical history.  There were no vitals filed for this visit.  Subjective Assessment - 05/21/19 1142    Subjective  I am doing my exercises and trying to walk the halls    Pertinent History  osteoporosis;  left shoulder rotator cuff tear;  "arthritis everywhere"    Patient Stated Goals  improve balance, strength and endurance    Currently in Pain?  No/denies                       [facility] Adult PT Treatment/Exercise - 05/21/19 0001      Exercises   Exercises  Shoulder      Knee/Hip Exercises: Standing   Heel Raises  Both;10 reps;2 sets    Hip Abduction  AROM;Right;Left;10 reps;2 sets    Abduction Limitations  2#    Hip Extension  AROM;Right;Left;10 reps;2 sets    Extension Limitations  2#    Other Standing Knee Exercises  step taps 1 minute     Other Standing Knee Exercises  weight shifting on black pad-3 directions      Knee/Hip Exercises: Seated   Long Arc Quad  AROM;Right;Left;20 reps    Long Arc Quad Weight  2 lbs.    Ball Squeeze  x20    Clamshell with TheraBand  Red   20x    Marching  AROM;Right;Left;20 reps    Marching Weights   2 lbs.      Shoulder Exercises: Seated   Row  Strengthening;Both;20 reps             PT Education - 05/21/19 1157    Education Details  Access Code: [email or web address]    Person(s) Educated  Patient    Methods  Explanation;Demonstration;Handout    Comprehension  Verbalized understanding;Returned demonstration       PT Short Term Goals - 05/14/19 1020      PT SHORT TERM GOAL #1   Title  be independent in initial HEP    Status  Achieved      PT SHORT TERM GOAL #2   Title  report regular walking during the day 5-10 minutes 3x/day to improve mobilty and endurance    Baseline  walking 2x/day for 10 minutes    Time  4    Period  Weeks    Status  On-going      PT SHORT TERM GOAL #3   Title  perform 5x sit to stand in < or = to 12 seconds to improve balance  Baseline  16.5 seconds    Time  4    Period  Weeks    Status  On-going        PT Long Term Goals - 05/01/19 1219      PT LONG TERM GOAL #1   Title  be independent in advanced HEP    Time  8    Period  Weeks    Status  New    Target Date  06/26/19      PT LONG TERM GOAL #2   Title  perform 5x sit to stand in < or = to 10 seconds to improve balance    Time  8    Period  Weeks    Status  New    Target Date  06/26/19      PT LONG TERM GOAL #3   Title  report compliance with daily exercise and walking for endurance progression    Time  8    Period  Weeks    Status  New    Target Date  06/26/19      PT LONG TERM GOAL #4   Title  demonstrate 4 to 4+/5 bilateral LE strength to improve safety and endurance in the community    Time  8    Period  Weeks    Status  New    Target Date  06/26/19            Plan - 05/21/19 1143    Clinical Impression Statement  Pt tolerated session well today and required stand by assistance for safety.  Pt is walking regularly at home in the long hallways and is doing this ~10 minutes, 2x/day.  Pt demonstrates improved ease with stand to sit and improved eccentric control with  stand to sit.  Pt continues to be challenged with current level of exercise.  Pt will continue to benefit from skilled PT for endurance, strength and balance to improve safety at home an in the community.    Rehab Potential  Good    PT Frequency  2x / week    PT Duration  8 weeks    PT Treatment/Interventions  ADLs/Self Care Home Management;Gait training;Stair training;Functional mobility training;Therapeutic activities;Therapeutic exercise;Balance training;Neuromuscular re-education;Manual techniques;Patient/family education;Passive range of motion    PT Next Visit Plan  balance ex, gait, endurance, strength; lower level initially;  add standing ex's to HEP as pt is ready for new exercises    PT Home Exercise Plan  Access Code: [email or web address]    Consulted and Agree with Plan of Care  Patient       Patient will benefit from skilled therapeutic intervention in order to improve the following deficits and impairments:  Abnormal gait, Decreased activity tolerance, Decreased balance, Decreased endurance, Decreased strength, Decreased safety awareness  Visit Diagnosis: Muscle weakness (generalized)  Other abnormalities of gait and mobility     Problem List There are no active problems to display for this patient.   Sigurd Sos, PT 05/21/19 12:24 PM  [facility] [address] [place], [place], [address] Phone: [phone]   Fax:  [phone]  Name: Kathryn Snyder MRN: [id number] Date of Birth: 13-Oct-1940

## 2019-05-23 ENCOUNTER — Ambulatory Visit: Payer: Medicare Other

## 2019-05-23 ENCOUNTER — Other Ambulatory Visit: Payer: Self-pay

## 2019-05-23 DIAGNOSIS — M6281 Muscle weakness (generalized): Secondary | ICD-10-CM | POA: Diagnosis not present

## 2019-05-23 DIAGNOSIS — R2689 Other abnormalities of gait and mobility: Secondary | ICD-10-CM

## 2019-05-23 NOTE — Therapy (Signed)
[facility] [address], [place], [place], [address] Phone: [phone]   Fax:  [phone]  Physical Therapy Treatment  Patient Details  Name: Kathryn Snyder MRN: [id number] Date of Birth: 03-17-1941 Referring Provider (PT): Jonathon Jordan, MD   Encounter Date: 05/23/2019  PT End of Session - 05/23/19 1140    Visit Number  6    Date for PT Re-Evaluation  06/26/19    Authorization Type  Medicare    PT Start Time  1102    PT Stop Time  1143    PT Time Calculation (min)  41 min    Activity Tolerance  Patient tolerated treatment well    Behavior During Therapy  [facility] for tasks assessed/performed       Past Medical History:  Diagnosis Date  . Anxiety   . Cancer ([place]) 1983   melanoma on back  . Depression   . Hypertension     History reviewed. No pertinent surgical history.  There were no vitals filed for this visit.  Subjective Assessment - 05/23/19 1106    Subjective  I am doing well.  No pain.    Currently in Pain?  No/denies                       [facility] Adult PT Treatment/Exercise - 05/23/19 0001      Knee/Hip Exercises: Aerobic   Nustep  L 2 x  8 min       Knee/Hip Exercises: Standing   Heel Raises  Both;10 reps;2 sets    Hip Abduction  AROM;Right;Left;10 reps;2 sets    Abduction Limitations  2#    Hip Extension  AROM;Right;Left;10 reps;2 sets    Extension Limitations  2#    Rocker Board  3 minutes    Other Standing Knee Exercises  step taps 1 minute     Other Standing Knee Exercises  weight shifting on black pad-3 directions      Knee/Hip Exercises: Seated   Long Arc Quad  AROM;Right;Left;20 reps    Long Arc Quad Weight  2 lbs.    Marching  AROM;Right;Left;20 reps    Marching Weights  2 lbs.      Shoulder Exercises: Standing   Row  Strengthening;Both;20 reps;Theraband    Theraband Level (Shoulder Row)  Level 2 (Red)    Row Limitations  verbal cues for scapular depression                PT Short Term Goals - 05/14/19 1020      PT SHORT TERM GOAL #1   Title  be independent in initial HEP    Status  Achieved      PT SHORT TERM GOAL #2   Title  report regular walking during the day 5-10 minutes 3x/day to improve mobilty and endurance    Baseline  walking 2x/day for 10 minutes    Time  4    Period  Weeks    Status  On-going      PT SHORT TERM GOAL #3   Title  perform 5x sit to stand in < or = to 12 seconds to improve balance    Baseline  16.5 seconds    Time  4    Period  Weeks    Status  On-going        PT Long Term Goals - 05/01/19 1219      PT LONG TERM GOAL #1   Title  be  independent in advanced HEP    Time  8    Period  Weeks    Status  New    Target Date  06/26/19      PT LONG TERM GOAL #2   Title  perform 5x sit to stand in < or = to 10 seconds to improve balance    Time  8    Period  Weeks    Status  New    Target Date  06/26/19      PT LONG TERM GOAL #3   Title  report compliance with daily exercise and walking for endurance progression    Time  8    Period  Weeks    Status  New    Target Date  06/26/19      PT LONG TERM GOAL #4   Title  demonstrate 4 to 4+/5 bilateral LE strength to improve safety and endurance in the community    Time  8    Period  Weeks    Status  New    Target Date  06/26/19            Plan - 05/23/19 1140    Clinical Impression Statement  Pt tolerated session well today and required stand by assistance for safety.  Pt is walking regularly at home in the long hallways and is doing this ~10 minutes, 2x/day.  Pt is making postural corrections with exercise without needed verbal cues.  Pt continues to be challenged with current level of exercise.  Pt will continue to benefit from skilled PT for endurance, strength and balance to improve safety at home and in the community.    PT Frequency  2x / week    PT Duration  8 weeks    PT Treatment/Interventions  ADLs/Self Care Home Management;Gait  training;Stair training;Functional mobility training;Therapeutic activities;Therapeutic exercise;Balance training;Neuromuscular re-education;Manual techniques;Patient/family education;Passive range of motion    PT Next Visit Plan  continue to advance balance, strength and endurance    PT Home Exercise Plan  Access Code: [email or web address]    Consulted and Agree with Plan of Care  Patient       Patient will benefit from skilled therapeutic intervention in order to improve the following deficits and impairments:  Abnormal gait, Decreased activity tolerance, Decreased balance, Decreased endurance, Decreased strength, Decreased safety awareness  Visit Diagnosis: Muscle weakness (generalized)  Other abnormalities of gait and mobility     Problem List There are no active problems to display for this patient.    Sigurd Sos, PT 05/23/19 11:42 AM  [facility] [address] [place], [place], [address] Phone: [phone]   Fax:  [phone]  Name: Kathryn Snyder MRN: [id number] Date of Birth: Feb 26, 1941

## 2019-05-28 ENCOUNTER — Ambulatory Visit: Payer: Medicare Other

## 2019-05-28 ENCOUNTER — Other Ambulatory Visit: Payer: Self-pay

## 2019-05-28 DIAGNOSIS — R2689 Other abnormalities of gait and mobility: Secondary | ICD-10-CM

## 2019-05-28 DIAGNOSIS — M6281 Muscle weakness (generalized): Secondary | ICD-10-CM

## 2019-05-28 NOTE — Therapy (Signed)
[facility] [address] [place], [place], [address] Phone: [phone]   Fax:  [phone]  Physical Therapy Treatment  Patient Details  Name: Kathryn Snyder MRN: [id number] Date of Birth: 04-18-1941 Referring Provider (PT): Jonathon Jordan, MD   Encounter Date: 05/28/2019  PT End of Session - 05/28/19 1137    Visit Number  7    Date for PT Re-Evaluation  06/26/19    Authorization Type  Medicare    PT Start Time  1100    PT Stop Time  1140    PT Time Calculation (min)  40 min    Activity Tolerance  Patient tolerated treatment well    Behavior During Therapy  [facility] for tasks assessed/performed       Past Medical History:  Diagnosis Date  . Anxiety   . Cancer ([place]) 1983   melanoma on back  . Depression   . Hypertension     History reviewed. No pertinent surgical history.  There were no vitals filed for this visit.  Subjective Assessment - 05/28/19 1117    Pertinent History  osteoporosis;  left shoulder rotator cuff tear;  "arthritis everywhere"    Currently in Pain?  No/denies         [facility] PT Assessment - 05/28/19 0001      Transfers   Five time sit to stand comments   14 seconds without hand                   [facility] Adult PT Treatment/Exercise - 05/28/19 0001      Knee/Hip Exercises: Aerobic   Nustep  L 2 x  8 min    PT present to discuss progress and to monitor     Knee/Hip Exercises: Standing   Heel Raises  Both;10 reps;2 sets    Hip Abduction  AROM;Right;Left;10 reps;2 sets    Abduction Limitations  2#    Hip Extension  AROM;Right;Left;10 reps;2 sets    Extension Limitations  2#    Rocker Board  3 minutes    Other Standing Knee Exercises  step taps 1 minute     Other Standing Knee Exercises  weight shifting on black pad-3 directions      Knee/Hip Exercises: Seated   Long Arc Quad  AROM;Right;Left;20 reps    Long Arc Quad Weight  2 lbs.    Marching  AROM;Right;Left;20 reps     Marching Weights  2 lbs.               PT Short Term Goals - 05/28/19 1119      PT SHORT TERM GOAL #2   Title  report regular walking during the day 5-10 minutes 3x/day to improve mobilty and endurance    Baseline  walking 2x/day for 10 minutes- goal this week is to increase to 3x    Time  4    Period  Weeks    Status  On-going      PT SHORT TERM GOAL #3   Title  perform 5x sit to stand in < or = to 12 seconds to improve balance    Baseline  14 seconds    Time  4    Period  Weeks    Status  On-going    Target Date  05/29/19        PT Long Term Goals - 05/01/19 1219      PT LONG TERM GOAL #1   Title  be independent in advanced  HEP    Time  8    Period  Weeks    Status  New    Target Date  06/26/19      PT LONG TERM GOAL #2   Title  perform 5x sit to stand in < or = to 10 seconds to improve balance    Time  8    Period  Weeks    Status  New    Target Date  06/26/19      PT LONG TERM GOAL #3   Title  report compliance with daily exercise and walking for endurance progression    Time  8    Period  Weeks    Status  New    Target Date  06/26/19      PT LONG TERM GOAL #4   Title  demonstrate 4 to 4+/5 bilateral LE strength to improve safety and endurance in the community    Time  8    Period  Weeks    Status  New    Target Date  06/26/19            Plan - 05/28/19 1125    Clinical Impression Statement  Pt tolerated session well today and required stand by assistance for safety.  Pt is walking regularly at home in the long hallways and is doing this ~10 minutes, 2x/day.  Goal for this week is to do this 3x/wk to work toward her goal.  5x sit to stand was 14 seconds indicating reduced falls risk.  Pt reports increased depression over the weekend and lacking motivation to exercise.  PT discussed the benefits of exercise with the patient.  Pt will continue to benefit from skilled PT for endurance, strength and balance to improve safety at home and in the  community.    PT Frequency  2x / week    PT Duration  8 weeks    PT Treatment/Interventions  ADLs/Self Care Home Management;Gait training;Stair training;Functional mobility training;Therapeutic activities;Therapeutic exercise;Balance training;Neuromuscular re-education;Manual techniques;Patient/family education;Passive range of motion    PT Next Visit Plan  continue to advance balance, strength and endurance    PT Home Exercise Plan  Access Code: [email or web address]    Consulted and Agree with Plan of Care  Patient       Patient will benefit from skilled therapeutic intervention in order to improve the following deficits and impairments:  Abnormal gait, Decreased activity tolerance, Decreased balance, Decreased endurance, Decreased strength, Decreased safety awareness  Visit Diagnosis: Other abnormalities of gait and mobility  Muscle weakness (generalized)     Problem List There are no active problems to display for this patient.    Sigurd Sos, PT 05/28/19 11:40 AM  [facility] [address] [place], [place], [address] Phone: [phone]   Fax:  [phone]  Name: Kathryn Snyder MRN: [id number] Date of Birth: September 07, 1940

## 2019-05-29 DIAGNOSIS — F411 Generalized anxiety disorder: Secondary | ICD-10-CM | POA: Diagnosis not present

## 2019-05-29 DIAGNOSIS — F331 Major depressive disorder, recurrent, moderate: Secondary | ICD-10-CM | POA: Diagnosis not present

## 2019-05-30 ENCOUNTER — Other Ambulatory Visit: Payer: Self-pay

## 2019-05-30 ENCOUNTER — Ambulatory Visit: Payer: Medicare Other

## 2019-05-30 DIAGNOSIS — M6281 Muscle weakness (generalized): Secondary | ICD-10-CM | POA: Diagnosis not present

## 2019-05-30 DIAGNOSIS — R2689 Other abnormalities of gait and mobility: Secondary | ICD-10-CM | POA: Diagnosis not present

## 2019-05-30 NOTE — Therapy (Signed)
[facility] [address] [place], [place], [address] Phone: [phone]   Fax:  [phone]  Physical Therapy Treatment  Patient Details  Name: Kathryn Snyder MRN: [id number] Date of Birth: 1940/12/01 Referring Provider (PT): Jonathon Jordan, MD   Encounter Date: 05/30/2019  PT End of Session - 05/30/19 1223    Visit Number  8    Date for PT Re-Evaluation  06/26/19    Authorization Type  Medicare    PT Start Time  1146    PT Stop Time  1226    PT Time Calculation (min)  40 min    Activity Tolerance  Patient tolerated treatment well    Behavior During Therapy  [facility] for tasks assessed/performed       Past Medical History:  Diagnosis Date  . Anxiety   . Cancer ([place]) 1983   melanoma on back  . Depression   . Hypertension     History reviewed. No pertinent surgical history.  There were no vitals filed for this visit.  Subjective Assessment - 05/30/19 1207    Subjective  I walked 40 minutes yesterday in the hallways.    Currently in Pain?  No/denies                       [facility] Adult PT Treatment/Exercise - 05/30/19 0001      Knee/Hip Exercises: Aerobic   Nustep  L 2 x  8 min    PT present to discuss progress and to monitor     Knee/Hip Exercises: Standing   Heel Raises  Both;10 reps;2 sets    Hip Abduction  AROM;Right;Left;10 reps;2 sets    Abduction Limitations  2.5# standing on black pad    Hip Extension  AROM;Right;Left;10 reps;2 sets    Extension Limitations  2.5# standing on black pad    Rocker Board  3 minutes    Other Standing Knee Exercises  step taps 1 minute       Knee/Hip Exercises: Seated   Long Arc Quad  AROM;Right;Left;20 reps    Long Arc Quad Weight  --   2.5#   Marching  AROM;Right;Left;20 reps    Marching Weights  --   2.5#              PT Short Term Goals - 05/28/19 1119      PT SHORT TERM GOAL #2   Title  report regular walking during the day 5-10  minutes 3x/day to improve mobilty and endurance    Baseline  walking 2x/day for 10 minutes- goal this week is to increase to 3x    Time  4    Period  Weeks    Status  On-going      PT SHORT TERM GOAL #3   Title  perform 5x sit to stand in < or = to 12 seconds to improve balance    Baseline  14 seconds    Time  4    Period  Weeks    Status  On-going    Target Date  05/29/19        PT Long Term Goals - 05/01/19 1219      PT LONG TERM GOAL #1   Title  be independent in advanced HEP    Time  8    Period  Weeks    Status  New    Target Date  06/26/19      PT LONG TERM GOAL #2  Title  perform 5x sit to stand in < or = to 10 seconds to improve balance    Time  8    Period  Weeks    Status  New    Target Date  06/26/19      PT LONG TERM GOAL #3   Title  report compliance with daily exercise and walking for endurance progression    Time  8    Period  Weeks    Status  New    Target Date  06/26/19      PT LONG TERM GOAL #4   Title  demonstrate 4 to 4+/5 bilateral LE strength to improve safety and endurance in the community    Time  8    Period  Weeks    Status  New    Target Date  06/26/19            Plan - 05/30/19 1207    Clinical Impression Statement  Pt increased her walking distance time to 40 minutes total yesterday (2x 20 minutes).  Pt reported that she felt some instability after walking this distance and PT advised her to reduce her total time if this continues.  PT discussed safe progression of walking program.  Pt tolerated advancement of strength exercises today with increased weights on ankles and standing on black pad with balance exercises. Pt required contact guard assistance for safety with standing balance exercises and verbal cues for speed of strength exercises today.  Pt will continue to benefit from skilled PT for strength, flexibility and endurance.    PT Frequency  2x / week    PT Duration  8 weeks    PT Treatment/Interventions  ADLs/Self Care  Home Management;Gait training;Stair training;Functional mobility training;Therapeutic activities;Therapeutic exercise;Balance training;Neuromuscular re-education;Manual techniques;Patient/family education;Passive range of motion    PT Next Visit Plan  continue to advance balance, strength and endurance    PT Home Exercise Plan  Access Code: [email or web address]       Patient will benefit from skilled therapeutic intervention in order to improve the following deficits and impairments:  Abnormal gait, Decreased activity tolerance, Decreased balance, Decreased endurance, Decreased strength, Decreased safety awareness  Visit Diagnosis: Other abnormalities of gait and mobility  Muscle weakness (generalized)     Problem List There are no active problems to display for this patient.    Sigurd Sos, PT 05/30/19 12:29 PM  [facility] [address] [place], [place], [address] Phone: [phone]   Fax:  [phone]  Name: Kathryn Snyder MRN: [id number] Date of Birth: 1941/04/05

## 2019-06-04 ENCOUNTER — Ambulatory Visit: Payer: Medicare Other | Admitting: Physical Therapy

## 2019-06-04 ENCOUNTER — Other Ambulatory Visit: Payer: Self-pay

## 2019-06-04 ENCOUNTER — Encounter: Payer: Self-pay | Admitting: Physical Therapy

## 2019-06-04 DIAGNOSIS — R2689 Other abnormalities of gait and mobility: Secondary | ICD-10-CM

## 2019-06-04 DIAGNOSIS — M6281 Muscle weakness (generalized): Secondary | ICD-10-CM

## 2019-06-04 NOTE — Therapy (Signed)
[facility] [address] [place], [place], [address] Phone: [phone]   Fax:  [phone]  Physical Therapy Treatment  Patient Details  Name: Kathryn Snyder MRN: [id number] Date of Birth: Sep 23, 1940 Referring Provider (PT): Jonathon Jordan, MD   Encounter Date: 06/04/2019  PT End of Session - 06/04/19 1057    Visit Number  9    Date for PT Re-Evaluation  06/26/19    Authorization Type  Medicare    PT Start Time  1055    PT Stop Time  1135    PT Time Calculation (min)  40 min    Activity Tolerance  Patient tolerated treatment well    Behavior During Therapy  [facility] for tasks assessed/performed       Past Medical History:  Diagnosis Date  . Anxiety   . Cancer ([place]) 1983   melanoma on back  . Depression   . Hypertension     History reviewed. No pertinent surgical history.  There were no vitals filed for this visit.  Subjective Assessment - 06/04/19 1059    Subjective  Walked 2x at 15 min each. Felt good after my last  workout here.    Pertinent History  osteoporosis;  left shoulder rotator cuff tear;  "arthritis everywhere"    Currently in Pain?  No/denies    Multiple Pain Sites  No                       [facility] Adult PT Treatment/Exercise - 06/04/19 0001      Knee/Hip Exercises: Aerobic   Nustep  L 2 x  10 min    PTA  present to discuss progress and to monitor     Knee/Hip Exercises: Standing   Heel Raises  Both;2 sets;15 reps    Heel Raises Limitations  Vc to "barely touch the bar"    Hip Abduction  AROM;Right;Left;2 sets;15 reps    Abduction Limitations  2.5# standing on black pad    Hip Extension  AROM;Right;Left;2 sets;15 reps    Extension Limitations  2.5# standing on black pad    Rocker Board  3 minutes    Other Standing Knee Exercises  step taps 1 minute  2 sets   Standing on black pad     Knee/Hip Exercises: Seated   Long Arc Quad  AAROM;Both;2 sets;15 reps;Weights    Long  Arc Quad Weight  --   2.5   Marching  AROM;Right;Left;20 reps    Marching Weights  --   2.5#   Sit to Sand  2 sets;10 reps;without UE support               PT Short Term Goals - 05/28/19 1119      PT SHORT TERM GOAL #2   Title  report regular walking during the day 5-10 minutes 3x/day to improve mobilty and endurance    Baseline  walking 2x/day for 10 minutes- goal this week is to increase to 3x    Time  4    Period  Weeks    Status  On-going      PT SHORT TERM GOAL #3   Title  perform 5x sit to stand in < or = to 12 seconds to improve balance    Baseline  14 seconds    Time  4    Period  Weeks    Status  On-going    Target Date  05/29/19  PT Long Term Goals - 05/01/19 1219      PT LONG TERM GOAL #1   Title  be independent in advanced HEP    Time  8    Period  Weeks    Status  New    Target Date  06/26/19      PT LONG TERM GOAL #2   Title  perform 5x sit to stand in < or = to 10 seconds to improve balance    Time  8    Period  Weeks    Status  New    Target Date  06/26/19      PT LONG TERM GOAL #3   Title  report compliance with daily exercise and walking for endurance progression    Time  8    Period  Weeks    Status  New    Target Date  06/26/19      PT LONG TERM GOAL #4   Title  demonstrate 4 to 4+/5 bilateral LE strength to improve safety and endurance in the community    Time  8    Period  Weeks    Status  New    Target Date  06/26/19            Plan - 06/04/19 1058    Clinical Impression Statement  Pt arrives to PT today without significant complaints. She enjoyed the increase in resistance last session and requested to continue with that amt 1x more. Instead of increasing resistance, we increased her reps to focus more on her endurance. Pt did shake with standing exercises, but is able to complete.    Personal Factors and Comorbidities  Age;Comorbidity 1;Fitness    Comorbidities  Lt knee pain, Lt shoulder OA, immobile     Examination-Activity Limitations  Locomotion Level;Stand;Stairs;Squat    Examination-Participation Restrictions  Community Activity;Meal Prep    Stability/Clinical Decision Making  Evolving/Moderate complexity    Rehab Potential  Good    PT Frequency  2x / week    PT Duration  8 weeks    PT Treatment/Interventions  ADLs/Self Care Home Management;Gait training;Stair training;Functional mobility training;Therapeutic activities;Therapeutic exercise;Balance training;Neuromuscular re-education;Manual techniques;Patient/family education;Passive range of motion    PT Next Visit Plan  continue to advance balance, strength and endurance, 10th visit PN next.    PT Home Exercise Plan  Access Code: [email or web address]    Consulted and Agree with Plan of Care  Patient       Patient will benefit from skilled therapeutic intervention in order to improve the following deficits and impairments:  Abnormal gait, Decreased activity tolerance, Decreased balance, Decreased endurance, Decreased strength, Decreased safety awareness  Visit Diagnosis: Other abnormalities of gait and mobility  Muscle weakness (generalized)     Problem List There are no active problems to display for this patient.   ,, PTA 06/04/2019, 11:37 AM  [facility] [address] [place], [place], [address] Phone: [phone]   Fax:  [phone]  Name: Kathryn Snyder MRN: [id number] Date of Birth: [birth year]

## 2019-06-06 ENCOUNTER — Ambulatory Visit: Payer: Medicare Other

## 2019-06-06 ENCOUNTER — Other Ambulatory Visit: Payer: Self-pay

## 2019-06-06 DIAGNOSIS — M6281 Muscle weakness (generalized): Secondary | ICD-10-CM | POA: Diagnosis not present

## 2019-06-06 DIAGNOSIS — R2689 Other abnormalities of gait and mobility: Secondary | ICD-10-CM | POA: Diagnosis not present

## 2019-06-06 NOTE — Therapy (Addendum)
[facility] [address], [place], [place], [address] Phone: [phone]   Fax:  [phone]  Physical Therapy Treatment  Patient Details  Name: Kathryn Snyder MRN: [id number] Date of Birth: Sep 20, 1940 Referring Provider (PT): Jonathon Jordan, MD   Encounter Date: 06/06/2019 Progress Note Reporting Period 05/01/2019  to 06/06/2019  See note below for Objective Data and Assessment of Progress/Goals.      PT End of Session - 06/06/19 1139    Visit Number  10    Date for PT Re-Evaluation  06/26/19    Authorization Type  Medicare    PT Start Time  1101    PT Stop Time  1140    PT Time Calculation (min)  39 min    Activity Tolerance  Patient tolerated treatment well    Behavior During Therapy  [facility] for tasks assessed/performed       Past Medical History:  Diagnosis Date  . Anxiety   . Cancer ([place]) 1983   melanoma on back  . Depression   . Hypertension     History reviewed. No pertinent surgical history.  There were no vitals filed for this visit.  Subjective Assessment - 06/06/19 1104    Subjective  I felt good after last session.    Pertinent History  osteoporosis;  left shoulder rotator cuff tear;  "arthritis everywhere"    Currently in Pain?  No/denies         [facility] PT Assessment - 06/06/19 0001      Assessment   Medical Diagnosis  balance and strength training, accidental fall    Referring Provider (PT)  Jonathon Jordan, MD    Onset Date/Surgical Date  11/29/18      Transfers   Five time sit to stand comments   12.78 seconds without hands                   [facility] Adult PT Treatment/Exercise - 06/06/19 0001      Knee/Hip Exercises: Aerobic   Nustep  L 2 x  10 min    PT  present to discuss progress and to monitor     Knee/Hip Exercises: Standing   Heel Raises  Both;2 sets;15 reps    Heel Raises Limitations  VC to reduce UE support    Hip Abduction  AROM;Right;Left;2 sets;15 reps     Abduction Limitations  2.5# standing on black pad    Hip Extension  AROM;Right;Left;2 sets;15 reps    Extension Limitations  2.5# standing on black pad    Rocker Board  3 minutes    Other Standing Knee Exercises  step taps 1 minute  2 sets   Standing on black pad     Knee/Hip Exercises: Seated   Long Arc Quad  AAROM;Both;2 sets;15 reps;Weights    Long Arc Quad Weight  --   2.5   Marching  AROM;Right;Left;20 reps    Marching Weights  --   2.5#   Sit to Sand  2 sets;10 reps;without UE support          Balance Exercises - 06/06/19 1134      Balance Exercises: Standing   Tandem Stance  Eyes open;3 reps;20 secs;Intermittent upper extremity support   each foot in front         PT Short Term Goals - 05/28/19 1119      PT SHORT TERM GOAL #2   Title  report regular walking during the day 5-10 minutes 3x/day to improve  mobilty and endurance    Baseline  walking 2x/day for 10 minutes- goal this week is to increase to 3x    Time  4    Period  Weeks    Status  On-going      PT SHORT TERM GOAL #3   Title  perform 5x sit to stand in < or = to 12 seconds to improve balance    Baseline  14 seconds    Time  4    Period  Weeks    Status  On-going    Target Date  05/29/19        PT Long Term Goals - 06/06/19 1106      PT LONG TERM GOAL #1   Title  be independent in advanced HEP    Time  8    Period  Weeks    Status  On-going      PT LONG TERM GOAL #2   Title  perform 5x sit to stand in < or = to 10 seconds to improve balance    Baseline  12.47 seconds    Time  8    Status  On-going      PT LONG TERM GOAL #3   Title  report compliance with daily exercise and walking for endurance progression    Baseline  walking 2-3 times a day 10-15 minutes    Status  Achieved      PT LONG TERM GOAL #4   Title  demonstrate 4 to 4+/5 bilateral LE strength to improve safety and endurance in the community    Time  8    Period  Weeks    Status  On-going            Plan -  06/06/19 1111    Clinical Impression Statement  Pt is making steady progress regarding balance and endurance.  Pt is independent in a regular walking program at home 2-3x/day for 10-15 minutes.  Pt reports fatigue with longer walks. Pt with improved time with 5x sit to stand indicating reduced risk of falls since the start of call.  Pt demonstrated fatigue and instability with increased reps in standing today.  Pt required stand by assistance for safety and to monitor for fatigue with exercise in the clinic.  Pt will continue to benefit from skilled PT for strength, endurance and balance training to improve safety at home and in the community.    PT Frequency  2x / week    PT Duration  8 weeks    PT Treatment/Interventions  ADLs/Self Care Home Management;Gait training;Stair training;Functional mobility training;Therapeutic activities;Therapeutic exercise;Balance training;Neuromuscular re-education;Manual techniques;Patient/family education;Passive range of motion    PT Next Visit Plan  continue to advance balance, strength and endurance    PT Home Exercise Plan  Access Code: [email or web address]    Consulted and Agree with Plan of Care  Patient       Patient will benefit from skilled therapeutic intervention in order to improve the following deficits and impairments:  Abnormal gait, Decreased activity tolerance, Decreased balance, Decreased endurance, Decreased strength, Decreased safety awareness  Visit Diagnosis: Muscle weakness (generalized)  Other abnormalities of gait and mobility     Problem List There are no active problems to display for this patient.    Sigurd Sos, PT 06/06/19 11:47 AM  [facility] [address], [place], [place], [address] Phone: [phone]   Fax:  [phone]  Name: Kathryn Snyder MRN: [id number] Date of Birth:  01/20/1941   

## 2019-06-13 ENCOUNTER — Ambulatory Visit: Payer: Medicare Other | Admitting: Physical Therapy

## 2019-06-13 ENCOUNTER — Other Ambulatory Visit: Payer: Self-pay

## 2019-06-13 ENCOUNTER — Encounter: Payer: Self-pay | Admitting: Physical Therapy

## 2019-06-13 DIAGNOSIS — M6281 Muscle weakness (generalized): Secondary | ICD-10-CM

## 2019-06-13 DIAGNOSIS — R2689 Other abnormalities of gait and mobility: Secondary | ICD-10-CM | POA: Diagnosis not present

## 2019-06-13 NOTE — Therapy (Signed)
[facility] [address] [place], [place], [address] Phone: [phone]   Fax:  [phone]  Physical Therapy Treatment  Patient Details  Name: Kathryn Snyder MRN: [id number] Date of Birth: 07-19-1941 Referring Provider (PT): Jonathon Jordan, MD   Encounter Date: 06/13/2019  PT End of Session - 06/13/19 1022    Visit Number  11    Date for PT Re-Evaluation  06/26/19    Authorization Type  Medicare    PT Start Time  1017    PT Stop Time  1055    PT Time Calculation (min)  38 min    Activity Tolerance  Patient tolerated treatment well    Behavior During Therapy  [facility] for tasks assessed/performed       Past Medical History:  Diagnosis Date  . Anxiety   . Cancer ([place]) 1983   melanoma on back  . Depression   . Hypertension     History reviewed. No pertinent surgical history.  There were no vitals filed for this visit.  Subjective Assessment - 06/13/19 1024    Subjective  Doing well, no new complaints.    Pertinent History  osteoporosis;  left shoulder rotator cuff tear;  "arthritis everywhere"    Currently in Pain?  No/denies    Multiple Pain Sites  No                       [facility] Adult PT Treatment/Exercise - 06/13/19 0001      Knee/Hip Exercises: Aerobic   Nustep  L 2 x  10 min    PTA  present to discuss progress and to monitor     Knee/Hip Exercises: Standing   Hip Abduction  AROM;Right;Left;2 sets;15 reps    Abduction Limitations  2.5# standing on black pad    Hip Extension  AROM;Right;Left;2 sets;15 reps    Extension Limitations  2.5# standing on black pad    Rocker Board  3 minutes    Other Standing Knee Exercises  step taps 1 minute  2 sets   Standing on black pad     Knee/Hip Exercises: Seated   Long Arc Quad  AAROM;Both;2 sets;15 reps;Weights    Long Arc Quad Weight  3 lbs.    Marching  AROM;Right;Left;20 reps    Marching Weights  2 lbs.    Sit to Sand  2 sets;10  reps;without UE support               PT Short Term Goals - 05/28/19 1119      PT SHORT TERM GOAL #2   Title  report regular walking during the day 5-10 minutes 3x/day to improve mobilty and endurance    Baseline  walking 2x/day for 10 minutes- goal this week is to increase to 3x    Time  4    Period  Weeks    Status  On-going      PT SHORT TERM GOAL #3   Title  perform 5x sit to stand in < or = to 12 seconds to improve balance    Baseline  14 seconds    Time  4    Period  Weeks    Status  On-going    Target Date  05/29/19        PT Long Term Goals - 06/06/19 1106      PT LONG TERM GOAL #1   Title  be independent in advanced HEP    Time  8    Period  Weeks    Status  On-going      PT LONG TERM GOAL #2   Title  perform 5x sit to stand in < or = to 10 seconds to improve balance    Baseline  12.47 seconds    Time  8    Status  On-going      PT LONG TERM GOAL #3   Title  report compliance with daily exercise and walking for endurance progression    Baseline  walking 2-3 times a day 10-15 minutes    Status  Achieved      PT LONG TERM GOAL #4   Title  demonstrate 4 to 4+/5 bilateral LE strength to improve safety and endurance in the community    Time  8    Period  Weeks    Status  On-going            Plan - 06/13/19 1022    Clinical Impression Statement  Attempted to increase her LE weights for strength progression, pt could only increase for seated exerecises and not the standing exs. Pt's ankles tremble while standing on the balance pad. By the end of treatment the entire LE would shake when performing standing exs. CGA with toe taps due to LE fatigue.    Personal Factors and Comorbidities  Age;Comorbidity 1;Fitness    Comorbidities  Lt knee pain, Lt shoulder OA, immobile    Examination-Activity Limitations  Locomotion Level;Stand;Stairs;Squat    Examination-Participation Restrictions  Community Activity;Meal Prep    Stability/Clinical Decision Making   Evolving/Moderate complexity    Rehab Potential  Good    PT Frequency  2x / week    PT Duration  8 weeks    PT Treatment/Interventions  ADLs/Self Care Home Management;Gait training;Stair training;Functional mobility training;Therapeutic activities;Therapeutic exercise;Balance training;Neuromuscular re-education;Manual techniques;Patient/family education;Passive range of motion    PT Next Visit Plan  continue to advance balance, strength and endurance    PT Home Exercise Plan  Access Code: [email or web address]    Consulted and Agree with Plan of Care  Patient       Patient will benefit from skilled therapeutic intervention in order to improve the following deficits and impairments:  Abnormal gait, Decreased activity tolerance, Decreased balance, Decreased endurance, Decreased strength, Decreased safety awareness  Visit Diagnosis: Muscle weakness (generalized)  Other abnormalities of gait and mobility     Problem List There are no active problems to display for this patient.   ,, PTA 06/13/2019, 10:55 AM  [facility] [address] [place], [place], [address] Phone: [phone]   Fax:  [phone]  Name: Cheramie Linne MRN: [id number] Date of Birth: 26-Apr-1941

## 2019-06-18 ENCOUNTER — Ambulatory Visit: Payer: Medicare Other

## 2019-06-18 DIAGNOSIS — F331 Major depressive disorder, recurrent, moderate: Secondary | ICD-10-CM | POA: Diagnosis not present

## 2019-06-18 DIAGNOSIS — F411 Generalized anxiety disorder: Secondary | ICD-10-CM | POA: Diagnosis not present

## 2019-06-20 ENCOUNTER — Other Ambulatory Visit: Payer: Self-pay

## 2019-06-20 ENCOUNTER — Ambulatory Visit: Payer: Medicare Other | Attending: Family Medicine | Admitting: Physical Therapy

## 2019-06-20 ENCOUNTER — Encounter: Payer: Self-pay | Admitting: Physical Therapy

## 2019-06-20 DIAGNOSIS — R2689 Other abnormalities of gait and mobility: Secondary | ICD-10-CM | POA: Insufficient documentation

## 2019-06-20 DIAGNOSIS — M6281 Muscle weakness (generalized): Secondary | ICD-10-CM | POA: Diagnosis not present

## 2019-06-20 NOTE — Therapy (Signed)
[facility] [address], [place], [place], [address] Phone: [phone]   Fax:  [phone]  Physical Therapy Treatment  Patient Details  Name: Kathryn Snyder MRN: [id number] Date of Birth: 1940/10/30 Referring Provider (PT): Jonathon Jordan, MD   Encounter Date: 06/20/2019  PT End of Session - 06/20/19 1010    Visit Number  12    Date for PT Re-Evaluation  06/26/19    Authorization Type  Medicare    PT Start Time  1009    PT Stop Time  1047    PT Time Calculation (min)  38 min    Activity Tolerance  Patient tolerated treatment well   pt's hands were very shakey thoughout the treatment   Behavior During Therapy  [facility] for tasks assessed/performed       Past Medical History:  Diagnosis Date  . Anxiety   . Cancer ([place]) 1983   melanoma on back  . Depression   . Hypertension     History reviewed. No pertinent surgical history.  There were no vitals filed for this visit.  Subjective Assessment - 06/20/19 1012    Subjective  Ever since the weather has gotten cold my Lt knee was been very achey.    Pertinent History  osteoporosis;  left shoulder rotator cuff tear;  "arthritis everywhere"    Currently in Pain?  Yes    Pain Score  6     Pain Location  Knee    Pain Orientation  Left    Pain Descriptors / Indicators  Aching    Aggravating Factors   probably the weather    Pain Relieving Factors  not much lately    Multiple Pain Sites  No                       [facility] Adult PT Treatment/Exercise - 06/20/19 0001      Knee/Hip Exercises: Aerobic   Nustep  L 2 x  10 min    PTA  present to discuss progress and to monitor     Knee/Hip Exercises: Standing   Heel Raises  Both;1 set;20 reps    Hip Abduction  Stengthening;Both;1 set;20 reps;Knee straight    Abduction Limitations  2.5# standing on black pad    Hip Extension  AROM;Right;Left;2 sets;20 reps;Knee straight    Extension Limitations  2.5#  standing on black pad    Rocker Board  3 minutes    Rebounder  weight shifting 3 ways 1 min, high knee marching 1 min No UE      Knee/Hip Exercises: Seated   Long Arc Quad  AAROM;Both;2 sets;15 reps;Weights    Long Arc Quad Weight  3 lbs.   Added ball squeeze to help decrease knee pain   Long Arc Quad Limitations  Pt's limbs quite shakey pre ex and during ex,     Marching  AROM;Right;Left;20 reps    Marching Weights  3 lbs.    Sit to Sand  2 sets;10 reps;without UE support   kept same as last session d/t LE shaking              PT Short Term Goals - 05/28/19 1119      PT SHORT TERM GOAL #2   Title  report regular walking during the day 5-10 minutes 3x/day to improve mobilty and endurance    Baseline  walking 2x/day for 10 minutes- goal this week is to increase to 3x  Time  4    Period  Weeks    Status  On-going      PT SHORT TERM GOAL #3   Title  perform 5x sit to stand in < or = to 12 seconds to improve balance    Baseline  14 seconds    Time  4    Period  Weeks    Status  On-going    Target Date  05/29/19        PT Long Term Goals - 06/06/19 1106      PT LONG TERM GOAL #1   Title  be independent in advanced HEP    Time  8    Period  Weeks    Status  On-going      PT LONG TERM GOAL #2   Title  perform 5x sit to stand in < or = to 10 seconds to improve balance    Baseline  12.47 seconds    Time  8    Status  On-going      PT LONG TERM GOAL #3   Title  report compliance with daily exercise and walking for endurance progression    Baseline  walking 2-3 times a day 10-15 minutes    Status  Achieved      PT LONG TERM GOAL #4   Title  demonstrate 4 to 4+/5 bilateral LE strength to improve safety and endurance in the community    Time  8    Period  Weeks    Status  On-going            Plan - 06/20/19 1011    Clinical Impression Statement  Pt reports since the onset of this colder weather pt reports her LT knee has been aching "pretty bad." This  has kept her from doing her walking program. From the onset of todays session pt's limbs were very shakey. The exercises only increased the shaking in her LE slightly but her UE shaking seemed to increase some. She reports she has a begnin tremor in her hands and some days it is just worse.    Personal Factors and Comorbidities  Age;Comorbidity 1;Fitness    Comorbidities  Lt knee pain, Lt shoulder OA, immobile    Examination-Activity Limitations  Locomotion Level;Stand;Stairs;Squat    Examination-Participation Restrictions  Community Activity;Meal Prep    Stability/Clinical Decision Making  Evolving/Moderate complexity    Rehab Potential  Good    PT Frequency  2x / week    PT Duration  8 weeks    PT Treatment/Interventions  ADLs/Self Care Home Management;Gait training;Stair training;Functional mobility training;Therapeutic activities;Therapeutic exercise;Balance training;Neuromuscular re-education;Manual techniques;Patient/family education;Passive range of motion    PT Next Visit Plan  ERO next visit    PT Home Exercise Plan  Access Code: [email or web address]    Consulted and Agree with Plan of Care  Patient       Patient will benefit from skilled therapeutic intervention in order to improve the following deficits and impairments:  Abnormal gait, Decreased activity tolerance, Decreased balance, Decreased endurance, Decreased strength, Decreased safety awareness  Visit Diagnosis: Muscle weakness (generalized)  Other abnormalities of gait and mobility     Problem List There are no active problems to display for this patient.   Arie Gable, PTA 06/20/2019, 10:49 AM  [facility] [address], [place], [place], [address] Phone: [phone]   Fax:  [phone]  Name: Kathryn Snyder MRN: [id number] Date of Birth: [birth year]

## 2019-06-26 ENCOUNTER — Ambulatory Visit: Payer: Medicare Other

## 2019-06-26 ENCOUNTER — Other Ambulatory Visit: Payer: Self-pay

## 2019-06-26 DIAGNOSIS — M6281 Muscle weakness (generalized): Secondary | ICD-10-CM | POA: Diagnosis not present

## 2019-06-26 DIAGNOSIS — R2689 Other abnormalities of gait and mobility: Secondary | ICD-10-CM | POA: Diagnosis not present

## 2019-06-26 NOTE — Therapy (Signed)
[facility] [address], [place], [place], [address] Phone: [phone]   Fax:  [phone]  Physical Therapy Treatment  Patient Details  Name: Kathryn Snyder MRN: [id number] Date of Birth: [birth year] Referring Provider (PT): Jonathon Jordan, MD   Encounter Date: 06/26/2019  PT End of Session - 06/26/19 1056    Visit Number  13    Date for PT Re-Evaluation  07/27/19    Authorization Type  Medicare    PT Start Time  1016    PT Stop Time  1104    PT Time Calculation (min)  48 min    Activity Tolerance  Patient tolerated treatment well    Behavior During Therapy  [facility] for tasks assessed/performed       Past Medical History:  Diagnosis Date  . Anxiety   . Cancer ([place]) 1983   melanoma on back  . Depression   . Hypertension     History reviewed. No pertinent surgical history.  There were no vitals filed for this visit.  Subjective Assessment - 06/26/19 1020    Subjective  My knees hurt after my last session.    Currently in Pain?  No/denies    Pain Score  0-No pain         [facility] PT Assessment - 06/26/19 0001      Assessment   Medical Diagnosis  balance and strength training, accidental fall    Referring Provider (PT)  Jonathon Jordan, MD    Onset Date/Surgical Date  11/29/18      Strength   Overall Strength  Deficits    Overall Strength Comments  bil knees 4+/5 hips 4+/5      Transfers   Five time sit to stand comments   12.96 seconds without hand      Ambulation/Gait   Ambulation/Gait  Yes    Ambulation/Gait Assistance  6: Modified independent (Device/Increase time)    Gait Pattern  Decreased stride length;Wide base of support;Step-through pattern    Gait Comments  mild staggering with change of direction      6 minute walk test results    Aerobic Endurance Distance Walked  957    Endurance additional comments  wide base of support and shortened step length      Balance   Balance Assessed   Yes      Static Standing Balance   Static Standing - Comment/# of Minutes  single leg stance: Rt 8 seconds, Lt 4 seconds                   [facility] Adult PT Treatment/Exercise - 06/26/19 0001      Knee/Hip Exercises: Aerobic   Nustep  L 2 x  10 min    PT present to discuss progress and to monitor     Knee/Hip Exercises: Standing   Hip Abduction  Stengthening;Both;1 set;20 reps;Knee straight    Abduction Limitations  2.5# standing on black pad    Hip Extension  AROM;Right;Left;2 sets;20 reps;Knee straight    Extension Limitations  2.5# standing on black pad    Rebounder  weight shifting 3 ways 1 min, high knee marching 1 min No UE      Knee/Hip Exercises: Seated   Long Arc Quad  AAROM;Both;2 sets;15 reps;Weights    Long Arc Quad Weight  3 lbs.    Sit to Sand  2 sets;10 reps;without UE support  PT Short Term Goals - 05/28/19 1119      PT SHORT TERM GOAL #2   Title  report regular walking during the day 5-10 minutes 3x/day to improve mobilty and endurance    Baseline  walking 2x/day for 10 minutes- goal this week is to increase to 3x    Time  4    Period  Weeks    Status  On-going      PT SHORT TERM GOAL #3   Title  perform 5x sit to stand in < or = to 12 seconds to improve balance    Baseline  14 seconds    Time  4    Period  Weeks    Status  On-going    Target Date  05/29/19        PT Long Term Goals - 06/26/19 1022      PT LONG TERM GOAL #1   Title  be independent in advanced HEP    Time  4    Period  Weeks    Status  On-going    Target Date  07/27/19      PT LONG TERM GOAL #2   Title  perform 5x sit to stand in < or = to 11.5 seconds to improve balance    Baseline  12.96    Time  4    Period  Weeks    Status  On-going    Target Date  07/27/19      PT LONG TERM GOAL #3   Title  report compliance with daily exercise and walking for endurance progression    Baseline  walking 2-3 times a day 10-15 minutes.  Limited to 15  minutes max at a moderate pace    Time  4    Period  Weeks    Status  On-going    Target Date  07/27/19      PT LONG TERM GOAL #4   Title  demonstrate 4 to 4+/5 bilateral LE strength to improve safety and endurance in the community    Baseline  4+/5 bil hip and knee strength    Status  Achieved      PT LONG TERM GOAL #5   Title  ambulate 1290  feet in 6 minutes to improve endurance for community ambulation    Time  4    Period  Weeks    Status  New    Target Date  07/27/19            Plan - 06/26/19 1056    Clinical Impression Statement  Pt is making steady progress toward goals.  Pt is walking for exercise 2x/day for 15 minutes.  Pt does report fatigue after 15 minutes and doesn't feel like she would be able to walk much longer.  5x sit to stand is 12.96 seconds (12.6 seconds is normal range for her age) and this indicates a mild falls risk.  Pt walked 957 feet in 6 minutes (1290 is minimum norm for her age).  Pt is independent and compliant in HEP and her LE strength has improved to 4+/5 throughout in her hips and knees.  Pt ambulates with a wide base of support and demonstrates mild staggering with change of position.  Pt will continue to benefit from skilled PT for LE strength, balance and endurance to improve safety at home and in the community.    Rehab Potential  Good    PT Frequency  2x / week    PT  Duration  4 weeks    PT Treatment/Interventions  ADLs/Self Care Home Management;Gait training;Stair training;Functional mobility training;Therapeutic activities;Therapeutic exercise;Balance training;Neuromuscular re-education;Manual techniques;Patient/family education;Passive range of motion    PT Next Visit Plan  continue to work on balance, strength and endurance    PT Home Exercise Plan  Access Code: [email or web address]    Recommended Other Services  recert sent [id number]       Patient will benefit from skilled therapeutic intervention in order to improve the following deficits and  impairments:  Abnormal gait, Decreased activity tolerance, Decreased balance, Decreased endurance, Decreased strength, Decreased safety awareness  Visit Diagnosis: Other abnormalities of gait and mobility - Plan: PT plan of care cert/re-cert  Muscle weakness (generalized) - Plan: PT plan of care cert/re-cert     Problem List There are no active problems to display for this patient.   Sigurd Sos, PT 06/26/19 11:06 AM  [facility] [address] [place], [place], [address] Phone: [phone]   Fax:  [phone]  Name: Kathryn Snyder MRN: [id number] Date of Birth: Jan 29, 1941

## 2019-07-02 ENCOUNTER — Other Ambulatory Visit: Payer: Self-pay

## 2019-07-02 ENCOUNTER — Ambulatory Visit: Payer: Medicare Other | Admitting: Physical Therapy

## 2019-07-02 ENCOUNTER — Encounter: Payer: Self-pay | Admitting: Physical Therapy

## 2019-07-02 DIAGNOSIS — M6281 Muscle weakness (generalized): Secondary | ICD-10-CM | POA: Diagnosis not present

## 2019-07-02 DIAGNOSIS — R2689 Other abnormalities of gait and mobility: Secondary | ICD-10-CM | POA: Diagnosis not present

## 2019-07-02 NOTE — Therapy (Signed)
[facility] [address] [place], [place], [address] Phone: [phone]   Fax:  [phone]  Physical Therapy Treatment  Patient Details  Name: Kathryn Snyder MRN: [id number] Date of Birth: September 05, 1940 Referring Provider (PT): Jonathon Jordan, MD   Encounter Date: 07/02/2019  PT End of Session - 07/02/19 1104    Visit Number  14    Date for PT Re-Evaluation  07/27/19    Authorization Type  Medicare    PT Start Time  1103    PT Stop Time  1141    PT Time Calculation (min)  38 min    Activity Tolerance  Patient tolerated treatment well    Behavior During Therapy  [facility] for tasks assessed/performed       Past Medical History:  Diagnosis Date  . Anxiety   . Cancer ([place]) 1983   melanoma on back  . Depression   . Hypertension     History reviewed. No pertinent surgical history.  There were no vitals filed for this visit.  Subjective Assessment - 07/02/19 1105    Subjective  No complaints today.    Pertinent History  osteoporosis;  left shoulder rotator cuff tear;  "arthritis everywhere"    Currently in Pain?  No/denies    Multiple Pain Sites  No                       [facility] Adult PT Treatment/Exercise - 07/02/19 0001      Lumbar Exercises: Supine   Bridge  10 reps;2 seconds      Knee/Hip Exercises: Aerobic   Nustep  L3 x  10 min    end of session when pt fatigued     Knee/Hip Exercises: Standing   Rocker Board  --   20x TC to stabilize hips   SLS  On black pad 3x 10 sec Bil, mild/mod UE support    Rebounder  weight shifting 3 ways 1 min, high knee marching 1 min No UE      Knee/Hip Exercises: Seated   Long Arc Quad  AAROM;Both;2 sets;15 reps;Weights    Long Arc Quad Weight  3 lbs.    Marching  AROM;Right;Left;20 reps    Marching Weights  3 lbs.    Sit to Sand  2 sets;10 reps;without UE support   Had pt hold 5# wt            PT Education - 07/02/19 1124    Education Details   Added to HEP supine bridge    Person(s) Educated  Patient    Methods  Explanation;Demonstration;Tactile cues;Verbal cues;Handout    Comprehension  Returned demonstration;Verbalized understanding       PT Short Term Goals - 05/28/19 1119      PT SHORT TERM GOAL #2   Title  report regular walking during the day 5-10 minutes 3x/day to improve mobilty and endurance    Baseline  walking 2x/day for 10 minutes- goal this week is to increase to 3x    Time  4    Period  Weeks    Status  On-going      PT SHORT TERM GOAL #3   Title  perform 5x sit to stand in < or = to 12 seconds to improve balance    Baseline  14 seconds    Time  4    Period  Weeks    Status  On-going    Target Date  05/29/19  PT Long Term Goals - 06/26/19 1022      PT LONG TERM GOAL #1   Title  be independent in advanced HEP    Time  4    Period  Weeks    Status  On-going    Target Date  07/27/19      PT LONG TERM GOAL #2   Title  perform 5x sit to stand in < or = to 11.5 seconds to improve balance    Baseline  12.96    Time  4    Period  Weeks    Status  On-going    Target Date  07/27/19      PT LONG TERM GOAL #3   Title  report compliance with daily exercise and walking for endurance progression    Baseline  walking 2-3 times a day 10-15 minutes.  Limited to 15 minutes max at a moderate pace    Time  4    Period  Weeks    Status  On-going    Target Date  07/27/19      PT LONG TERM GOAL #4   Title  demonstrate 4 to 4+/5 bilateral LE strength to improve safety and endurance in the community    Baseline  4+/5 bil hip and knee strength    Status  Achieved      PT LONG TERM GOAL #5   Title  ambulate 1290  feet in 6 minutes to improve endurance for community ambulation    Time  4    Period  Weeks    Status  New    Target Date  07/27/19            Plan - 07/02/19 1122    Clinical Impression Statement  Pt arrives to PT with no now complaints. PTA added supine bridge to HEP to help  strengthen her gluteals which appeared weak during single leg stance today. Pt's LE visably shakey in single leg stance with some pelvic drop.    Personal Factors and Comorbidities  Age;Comorbidity 1;Fitness    Comorbidities  Lt knee pain, Lt shoulder OA, immobile    Examination-Activity Limitations  Locomotion Level;Stand;Stairs;Squat    Examination-Participation Restrictions  Community Activity;Meal Prep    Stability/Clinical Decision Making  Evolving/Moderate complexity    Rehab Potential  Good    PT Frequency  2x / week    PT Duration  4 weeks    PT Treatment/Interventions  ADLs/Self Care Home Management;Gait training;Stair training;Functional mobility training;Therapeutic activities;Therapeutic exercise;Balance training;Neuromuscular re-education;Manual techniques;Patient/family education;Passive range of motion    PT Next Visit Plan  continue to work on balance, strength and endurance    PT Home Exercise Plan  Access Code: [email or web address]       Patient will benefit from skilled therapeutic intervention in order to improve the following deficits and impairments:  Abnormal gait, Decreased activity tolerance, Decreased balance, Decreased endurance, Decreased strength, Decreased safety awareness  Visit Diagnosis: Other abnormalities of gait and mobility  Muscle weakness (generalized)     Problem List There are no active problems to display for this patient.   Daric Koren, PTA 07/02/2019, 11:35 AM  [facility] [address] [place], [place], [address] Phone: [phone]   Fax:  [phone]  Name: Kathryn Snyder MRN: [id number] Date of Birth: 03/17/1941  Access Code: [email or web address]  URL: [email or web address]  Date: 07/02/2019  Prepared by: Myrene Galas   Exercises  Seated Long Arc Quad - 10 reps - 2 sets -  5 hold - 3x daily - 7x weekly  Seated March - 10 reps - 3 sets - 3x daily - 7x weekly  Seated  Heel Toe Raises - 10 reps - 2 sets - 3x daily - 7x weekly  Seated Heel Raise - 10 reps - 2 sets - 3x daily - 7x weekly  Sit to Stand - 15 reps - 2 sets - 3x daily - 7x weekly  Seated Hip Adduction Squeeze with Ball - 15 reps - 1 sets - 5 hold - 1x daily - 7x weekly  Seated Hip Abduction with Resistance - 10 reps - 1 sets - 1x daily - 7x weekly  Seated Eccentric Abdominal Lean Back - 10 reps - 1 sets - 1x daily - 7x weekly  Standing Hip Abduction - 10 reps - 2 sets - 2x daily - 7x weekly  Standing Hip Extension - 10 reps - 2 sets - 2x daily - 7x weekly  Supine Bridge - 10 reps - 1 sets - 5 hold - 1x daily - 7x weekly

## 2019-07-04 ENCOUNTER — Encounter: Payer: Self-pay | Admitting: Physical Therapy

## 2019-07-04 ENCOUNTER — Other Ambulatory Visit: Payer: Self-pay

## 2019-07-04 ENCOUNTER — Ambulatory Visit: Payer: Medicare Other | Admitting: Physical Therapy

## 2019-07-04 DIAGNOSIS — R2689 Other abnormalities of gait and mobility: Secondary | ICD-10-CM | POA: Diagnosis not present

## 2019-07-04 DIAGNOSIS — M6281 Muscle weakness (generalized): Secondary | ICD-10-CM | POA: Diagnosis not present

## 2019-07-04 NOTE — Therapy (Signed)
[facility] [address] [place], [place], [address] Phone: [phone]   Fax:  [phone]  Physical Therapy Treatment  Patient Details  Name: Kathryn Snyder MRN: [id number] Date of Birth: 02/25/1941 Referring Provider (PT): Jonathon Jordan, MD   Encounter Date: 07/04/2019  PT End of Session - 07/04/19 1106    Visit Number  15    Date for PT Re-Evaluation  07/27/19    Authorization Type  Medicare    PT Start Time  1105   late   PT Stop Time  1143    PT Time Calculation (min)  38 min    Activity Tolerance  Patient tolerated treatment well    Behavior During Therapy  [facility] for tasks assessed/performed       Past Medical History:  Diagnosis Date  . Anxiety   . Cancer ([place]) 1983   melanoma on back  . Depression   . Hypertension     History reviewed. No pertinent surgical history.  There were no vitals filed for this visit.  Subjective Assessment - 07/04/19 1109    Subjective  I felt fine after my last appt. I wish my buttocks got sore, but they didn't.    Pertinent History  osteoporosis;  left shoulder rotator cuff tear;  "arthritis everywhere"    Currently in Pain?  No/denies    Multiple Pain Sites  No         [facility] PT Assessment - 07/04/19 0001      Transfers   Five time sit to stand comments   10 sec no UE                   [facility] Adult PT Treatment/Exercise - 07/04/19 0001      Neuro Re-ed    Neuro Re-ed Details   stepping over noodle sideways 10x  No UE ,       Lumbar Exercises: Supine   Bridge  20 reps;3 seconds      Knee/Hip Exercises: Aerobic   Nustep  L3 x  10 min    end of session when pt fatigued: LE only     Knee/Hip Exercises: Standing   SLS  On black pad 3x 15 sec Bil, mild/mod UE support    Rebounder  weight shifting 3 ways 1 min, high knee marching 1 min No UE      Knee/Hip Exercises: Seated   Long Arc Quad  AAROM;Both;2 sets;15 reps;Weights    Long Arc Quad  Weight  3 lbs.    Marching  AROM;Right;Left;2 sets;20 reps    Marching Weights  3 lbs.               PT Short Term Goals - 07/04/19 1139      PT SHORT TERM GOAL #3   Title  perform 5x sit to stand in < or = to 12 seconds to improve balance    Baseline  --   11 sec   Time  4    Period  Weeks    Status  Achieved        PT Long Term Goals - 06/26/19 1022      PT LONG TERM GOAL #1   Title  be independent in advanced HEP    Time  4    Period  Weeks    Status  On-going    Target Date  07/27/19      PT LONG TERM GOAL #2   Title  perform 5x sit to stand in < or = to 11.5 seconds to improve balance    Baseline  12.96    Time  4    Period  Weeks    Status  On-going    Target Date  07/27/19      PT LONG TERM GOAL #3   Title  report compliance with daily exercise and walking for endurance progression    Baseline  walking 2-3 times a day 10-15 minutes.  Limited to 15 minutes max at a moderate pace    Time  4    Period  Weeks    Status  On-going    Target Date  07/27/19      PT LONG TERM GOAL #4   Title  demonstrate 4 to 4+/5 bilateral LE strength to improve safety and endurance in the community    Baseline  4+/5 bil hip and knee strength    Status  Achieved      PT LONG TERM GOAL #5   Title  ambulate 1290  feet in 6 minutes to improve endurance for community ambulation    Time  4    Period  Weeks    Status  New    Target Date  07/27/19            Plan - 07/04/19 1110    Clinical Impression Statement  Pt arrives without new complaints. She has lower confidence with new balance exercises ( stepping over noodle) and single leg stance on the black pod. As she practices her confidence does improve. Pt did achieve sit to stand goal perfroming 5x in 10 sec today with no UE.    Personal Factors and Comorbidities  Age;Comorbidity 1;Fitness    Comorbidities  Lt knee pain, Lt shoulder OA, immobile    Examination-Activity Limitations  Locomotion  Level;Stand;Stairs;Squat    Examination-Participation Restrictions  Community Activity;Meal Prep    Stability/Clinical Decision Making  Evolving/Moderate complexity    Rehab Potential  Good    PT Frequency  2x / week    PT Duration  4 weeks    PT Treatment/Interventions  ADLs/Self Care Home Management;Gait training;Stair training;Functional mobility training;Therapeutic activities;Therapeutic exercise;Balance training;Neuromuscular re-education;Manual techniques;Patient/family education;Passive range of motion    PT Next Visit Plan  continue to work on balance, strength and endurance: 6 min walk test next week.    PT Home Exercise Plan  Access Code: [email or web address]    Consulted and Agree with Plan of Care  Patient       Patient will benefit from skilled therapeutic intervention in order to improve the following deficits and impairments:  Abnormal gait, Decreased activity tolerance, Decreased balance, Decreased endurance, Decreased strength, Decreased safety awareness  Visit Diagnosis: Other abnormalities of gait and mobility  Muscle weakness (generalized)     Problem List There are no active problems to display for this patient.   Kathryn Snyder, PTA 07/04/2019, 11:42 AM  [facility] [address] [place], [place], [address] Phone: [phone]   Fax:  [phone]  Name: Kathryn Snyder MRN: [id number] Date of Birth: 03/16/1941

## 2019-07-09 ENCOUNTER — Encounter: Payer: Self-pay | Admitting: Physical Therapy

## 2019-07-09 ENCOUNTER — Ambulatory Visit: Payer: Medicare Other | Admitting: Physical Therapy

## 2019-07-09 ENCOUNTER — Other Ambulatory Visit: Payer: Self-pay

## 2019-07-09 DIAGNOSIS — R2689 Other abnormalities of gait and mobility: Secondary | ICD-10-CM

## 2019-07-09 DIAGNOSIS — M6281 Muscle weakness (generalized): Secondary | ICD-10-CM

## 2019-07-09 NOTE — Therapy (Signed)
[facility] [address] [place], [place], [address] Phone: [phone]   Fax:  [phone]  Physical Therapy Treatment  Patient Details  Name: Kathryn Snyder MRN: [id number] Date of Birth: 1940/09/21 Referring Provider (PT): Jonathon Jordan, MD   Encounter Date: 07/09/2019  PT End of Session - 07/09/19 1109    Visit Number  16    Date for PT Re-Evaluation  07/27/19    Authorization Type  Medicare    PT Start Time  1105    PT Stop Time  1145    PT Time Calculation (min)  40 min    Activity Tolerance  Patient tolerated treatment well    Behavior During Therapy  [facility] for tasks assessed/performed       Past Medical History:  Diagnosis Date  . Anxiety   . Cancer ([place]) 1983   melanoma on back  . Depression   . Hypertension     History reviewed. No pertinent surgical history.  There were no vitals filed for this visit.  Subjective Assessment - 07/09/19 1110    Subjective  No new complaints.    Currently in Pain?  No/denies    Multiple Pain Sites  No         [facility] PT Assessment - 07/09/19 0001      6 minute walk test results    Aerobic Endurance Distance Walked  1121                   [facility] Adult PT Treatment/Exercise - 07/09/19 0001      Knee/Hip Exercises: Aerobic   Nustep  L3 x  10 min    end of session when pt fatigued: LE only     Knee/Hip Exercises: Seated   Long Arc Quad  AAROM;Both;2 sets;15 reps;Weights    Long Arc Quad Weight  3 lbs.    Clamshell with TheraBand  Blue   2x25   Marching  AROM;Right;Left;2 sets;20 reps    Marching Weights  3 lbs.      Shoulder Exercises: Standing   Row  Strengthening;Left;20 reps;Theraband    Theraband Level (Shoulder Row)  Level 1 (Yellow)             PT Education - 07/09/19 1136    Education Details  Yellow band standing rows for HEP, issued band for HEP    Person(s) Educated  Patient    Methods  Explanation;Demonstration;Tactile  cues;Verbal cues;Handout    Comprehension  Returned demonstration;Verbalized understanding       PT Short Term Goals - 07/04/19 1139      PT SHORT TERM GOAL #3   Title  perform 5x sit to stand in < or = to 12 seconds to improve balance    Baseline  --   11 sec   Time  4    Period  Weeks    Status  Achieved        PT Long Term Goals - 07/09/19 1124      PT LONG TERM GOAL #5   Title  ambulate 1290  feet in 6 minutes to improve endurance for community ambulation    Time  4    Period  Weeks    Status  On-going   1121 ft           Plan - 07/09/19 1109    Clinical Impression Statement  pt arrives initially with no complaints. As we discussed her balance more throughout the session she reports  she is concerned about her balance when she walks down a hill/incline. At times she feels like she cannot stop her body from "falling forward." We discussed how the strength of her core and back muscles helps to control that action ( not falling forward). She was able to perform yellow band rows in standing for additional HEP. Initially pt had a hard time standing erect as she pulled on the band, but she improved with practice as she was able to find her core better. Pt improved her 6 min walk test but did not yet meethte long term goal.    Personal Factors and Comorbidities  Age;Comorbidity 1;Fitness    Comorbidities  Lt knee pain, Lt shoulder OA, immobile    Examination-Activity Limitations  Locomotion Level;Stand;Stairs;Squat    Examination-Participation Restrictions  Community Activity;Meal Prep    Stability/Clinical Decision Making  Evolving/Moderate complexity    Rehab Potential  Good    PT Frequency  2x / week    PT Duration  4 weeks    PT Treatment/Interventions  ADLs/Self Care Home Management;Gait training;Stair training;Functional mobility training;Therapeutic activities;Therapeutic exercise;Balance training;Neuromuscular re-education;Manual techniques;Patient/family  education;Passive range of motion    PT Next Visit Plan  continue to work on balance perhaps try shoulder extensions to further add to her HEP to strengthen core and trunk.    PT Home Exercise Plan  Access Code: [email or web address]    Consulted and Agree with Plan of Care  Patient       Patient will benefit from skilled therapeutic intervention in order to improve the following deficits and impairments:  Abnormal gait, Decreased activity tolerance, Decreased balance, Decreased endurance, Decreased strength, Decreased safety awareness  Visit Diagnosis: Other abnormalities of gait and mobility  Muscle weakness (generalized)     Problem List There are no active problems to display for this patient.   Xerxes Agrusa, PTA 07/09/2019, 11:42 AM  [facility] [address] [place], [place], [address] Phone: [phone]   Fax:  [phone]  Name: Kathryn Snyder MRN: [id number] Date of Birth: 05-22-1941  Access Code: [email or web address]  URL: [email or web address]  Date: 07/09/2019  Prepared by: Myrene Galas   Exercises  Seated Long Arc Quad - 10 reps - 2 sets - 5 hold - 3x daily - 7x weekly  Seated March - 10 reps - 3 sets - 3x daily - 7x weekly  Seated Heel Toe Raises - 10 reps - 2 sets - 3x daily - 7x weekly  Seated Heel Raise - 10 reps - 2 sets - 3x daily - 7x weekly  Sit to Stand - 15 reps - 2 sets - 3x daily - 7x weekly  Seated Hip Adduction Squeeze with Ball - 15 reps - 1 sets - 5 hold - 1x daily - 7x weekly  Seated Hip Abduction with Resistance - 10 reps - 1 sets - 1x daily - 7x weekly  Seated Eccentric Abdominal Lean Back - 10 reps - 1 sets - 1x daily - 7x weekly  Standing Hip Abduction - 10 reps - 2 sets - 2x daily - 7x weekly  Standing Hip Extension - 10 reps - 2 sets - 2x daily - 7x weekly  Supine Bridge - 10 reps - 1 sets - 5 hold - 1x daily - 7x weekly  Standing Row with Anchored Resistance - 10 reps - 2 sets  - 1x daily - 7x weekly

## 2019-07-16 ENCOUNTER — Ambulatory Visit: Payer: Medicare Other

## 2019-07-16 ENCOUNTER — Other Ambulatory Visit: Payer: Self-pay

## 2019-07-16 DIAGNOSIS — Z20822 Contact with and (suspected) exposure to covid-19: Secondary | ICD-10-CM

## 2019-07-16 DIAGNOSIS — Z20828 Contact with and (suspected) exposure to other viral communicable diseases: Secondary | ICD-10-CM | POA: Diagnosis not present

## 2019-07-17 LAB — NOVEL CORONAVIRUS, NAA: SARS-CoV-2, NAA: NOT DETECTED

## 2019-07-18 ENCOUNTER — Encounter: Payer: Medicare Other | Admitting: Physical Therapy

## 2019-07-23 ENCOUNTER — Encounter: Payer: Medicare Other | Admitting: Physical Therapy

## 2019-07-23 DIAGNOSIS — F331 Major depressive disorder, recurrent, moderate: Secondary | ICD-10-CM | POA: Diagnosis not present

## 2019-07-24 ENCOUNTER — Ambulatory Visit: Payer: Medicare Other | Attending: Family Medicine

## 2019-07-24 ENCOUNTER — Other Ambulatory Visit: Payer: Self-pay

## 2019-07-24 DIAGNOSIS — M6281 Muscle weakness (generalized): Secondary | ICD-10-CM | POA: Insufficient documentation

## 2019-07-24 DIAGNOSIS — R2689 Other abnormalities of gait and mobility: Secondary | ICD-10-CM | POA: Insufficient documentation

## 2019-07-24 NOTE — Therapy (Signed)
[facility] [address], [place], [place], [address] Phone: [phone]   Fax:  [phone]  Physical Therapy Treatment  Patient Details  Name: Kathryn Snyder MRN: [id number] Date of Birth: 1941/06/21 Referring Provider (PT): Jonathon Jordan, MD   Encounter Date: 07/24/2019  PT End of Session - 07/24/19 0923    Visit Number  17    Date for PT Re-Evaluation  07/27/19    Authorization Type  Medicare    PT Start Time  0846    PT Stop Time  0925    PT Time Calculation (min)  39 min    Activity Tolerance  Patient tolerated treatment well    Behavior During Therapy  [facility] for tasks assessed/performed       Past Medical History:  Diagnosis Date  . Anxiety   . Cancer ([place]) 1983   melanoma on back  . Depression   . Hypertension     History reviewed. No pertinent surgical history.  There were no vitals filed for this visit.  Subjective Assessment - 07/24/19 0852    Subjective  Lapse in treatment due to being Covid tested secondary to exposure.    Currently in Pain?  No/denies         [facility] PT Assessment - 07/24/19 0001      Assessment   Medical Diagnosis  balance and strength training, accidental fall    Referring Provider (PT)  Jonathon Jordan, MD    Onset Date/Surgical Date  11/29/18      Prior Function   Level of Independence  Independent      AROM   Overall AROM   Deficits    Overall AROM Comments  A/ROM is WFLS.  Lt shoulder is limited to < 90 degrees flexion due to severe OA per pt report      Transfers   Transfers  Sit to Stand;Stand to Sit    Stand to Sit  With upper extremity assist      Ambulation/Gait   Ambulation/Gait  Yes    Ambulation/Gait Assistance  6: Modified independent (Device/Increase time)    Gait Pattern  Decreased stride length;Wide base of support;Step-through pattern                   [facility] Adult PT Treatment/Exercise - 07/24/19 0001      Knee/Hip Exercises:  Aerobic   Nustep  L3 x  10 min    end of session when pt fatigued: LE only     Knee/Hip Exercises: Standing   Heel Raises  Both;1 set;20 reps    Hip Abduction  Stengthening;Both;1 set;20 reps;Knee straight    Abduction Limitations  2# added    Hip Extension  AROM;Right;Left;2 sets;20 reps;Knee straight    Extension Limitations  2# added      Knee/Hip Exercises: Seated   Long Arc Quad  AAROM;Both;2 sets;15 reps;Weights    Long Arc Quad Weight  3 lbs.    Clamshell with TheraBand  Blue   2x25   Marching  AROM;Right;Left;2 sets;20 reps    Marching Weights  3 lbs.      Shoulder Exercises: Standing   Row  Strengthening;Left;20 reps;Theraband    Theraband Level (Shoulder Row)  Level 2 (Red)               PT Short Term Goals - 07/04/19 1139      PT SHORT TERM GOAL #3   Title  perform 5x sit to stand in < or =  to 12 seconds to improve balance    Baseline  --   11 sec   Time  4    Period  Weeks    Status  Achieved        PT Long Term Goals - 07/09/19 1124      PT LONG TERM GOAL #5   Title  ambulate 1290  feet in 6 minutes to improve endurance for community ambulation    Time  4    Period  Weeks    Status  On-going   1121 ft           Plan - 07/24/19 0907    Clinical Impression Statement  Pt had a lapse in treatment as she was being tested for COVID.  Pt has remained consistent with HEP for strength.  Pt requires verbal cues for hip alignment with seated long arc quads to avoid hip adduction.  Pt requires stand by assistance for safety and cueing.  Pt will attend 1 more session for final goal assessment and to finalize HEP.    PT Frequency  2x / week    PT Duration  4 weeks    PT Treatment/Interventions  ADLs/Self Care Home Management;Gait training;Stair training;Functional mobility training;Therapeutic activities;Therapeutic exercise;Balance training;Neuromuscular re-education;Manual techniques;Patient/family education;Passive range of motion    PT Next Visit  Plan  finalize HEP, check goals    PT Home Exercise Plan  Access Code: [email or web address]    Recommended Other Services  recert is signed    Consulted and Agree with Plan of Care  Patient       Patient will benefit from skilled therapeutic intervention in order to improve the following deficits and impairments:  Abnormal gait, Decreased activity tolerance, Decreased balance, Decreased endurance, Decreased strength, Decreased safety awareness  Visit Diagnosis: Muscle weakness (generalized)  Other abnormalities of gait and mobility     Problem List There are no active problems to display for this patient.   Sigurd Sos, PT 07/24/19 9:30 AM  [facility] [address] [place], [place], [address] Phone: [phone]   Fax:  [phone]  Name: Kathryn Snyder MRN: [id number] Date of Birth: September 07, 1940

## 2019-07-25 ENCOUNTER — Ambulatory Visit: Payer: Medicare Other

## 2019-07-25 ENCOUNTER — Other Ambulatory Visit: Payer: Self-pay

## 2019-07-25 DIAGNOSIS — F331 Major depressive disorder, recurrent, moderate: Secondary | ICD-10-CM | POA: Diagnosis not present

## 2019-07-25 DIAGNOSIS — M6281 Muscle weakness (generalized): Secondary | ICD-10-CM | POA: Diagnosis not present

## 2019-07-25 DIAGNOSIS — R2689 Other abnormalities of gait and mobility: Secondary | ICD-10-CM

## 2019-07-25 DIAGNOSIS — F411 Generalized anxiety disorder: Secondary | ICD-10-CM | POA: Diagnosis not present

## 2019-07-25 NOTE — Therapy (Signed)
[facility] [address] [place], [place], [address] Phone: [phone]   Fax:  [phone]  Physical Therapy Treatment  Patient Details  Name: Kathryn Snyder MRN: [id number] Date of Birth: October 23, 1940 Referring Provider (PT): Jonathon Jordan, MD   Encounter Date: 07/25/2019  PT End of Session - 07/25/19 1149    Visit Number  18    Date for PT Re-Evaluation  07/27/19    Authorization Type  Medicare    PT Start Time  1103    PT Stop Time  1146    PT Time Calculation (min)  43 min    Activity Tolerance  Patient tolerated treatment well    Behavior During Therapy  [facility] for tasks assessed/performed       Past Medical History:  Diagnosis Date  . Anxiety   . Cancer ([place]) 1983   melanoma on back  . Depression   . Hypertension     History reviewed. No pertinent surgical history.  There were no vitals filed for this visit.      [facility] PT Assessment - 07/25/19 0001      Assessment   Medical Diagnosis  balance and strength training, accidental fall    Referring Provider (PT)  Jonathon Jordan, MD    Onset Date/Surgical Date  11/29/18      Prior Function   Level of Independence  Independent      AROM   Overall AROM   Deficits    Overall AROM Comments  A/ROM is WFLS.  Lt shoulder is limited to < 90 degrees flexion due to severe OA per pt report      Transfers   Five time sit to stand comments   11.97 seconds without UE support                   [facility] Adult PT Treatment/Exercise - 07/25/19 0001      Knee/Hip Exercises: Aerobic   Nustep  L3 x  10 min    end of session when pt fatigued: LE only     Knee/Hip Exercises: Standing   Heel Raises  Both;1 set;20 reps    Hip Abduction  Stengthening;Both;1 set;20 reps;Knee straight    Abduction Limitations  2# added    Hip Extension  AROM;Right;Left;2 sets;20 reps;Knee straight    Extension Limitations  2# added      Knee/Hip Exercises: Seated   Long  Arc Quad  AAROM;Both;2 sets;15 reps;Weights    Long Arc Quad Weight  3 lbs.    Clamshell with TheraBand  Blue   2x25   Marching  AROM;Right;Left;2 sets;20 reps    Marching Weights  3 lbs.      Shoulder Exercises: Standing   Row  Strengthening;Left;20 reps;Theraband    Theraband Level (Shoulder Row)  Level 2 (Red)               PT Short Term Goals - 07/04/19 1139      PT SHORT TERM GOAL #3   Title  perform 5x sit to stand in < or = to 12 seconds to improve balance    Baseline  --   11 sec   Time  4    Period  Weeks    Status  Achieved        PT Long Term Goals - 07/25/19 1106      PT LONG TERM GOAL #1   Title  be independent in advanced HEP    Status  Achieved  PT LONG TERM GOAL #2   Title  perform 5x sit to stand in < or = to 11.5 seconds to improve balance    Baseline  11.97    Status  Partially Met      PT LONG TERM GOAL #3   Title  report compliance with daily exercise and walking for endurance progression    Baseline  walking daily    Status  Achieved      PT LONG TERM GOAL #4   Title  demonstrate 4 to 4+/5 bilateral LE strength to improve safety and endurance in the community    Baseline  4+/5 bil hip and knee strength    Status  Achieved      PT LONG TERM GOAL #5   Title  ambulate 1290  feet in 6 minutes to improve endurance for community ambulation    Baseline  1121 ft.  Not tested today    Status  Partially Met            Plan - 07/25/19 1125    Clinical Impression Statement  Pt is independent and compliant in HEP for further strength and endurance gains.  Pt will continue to work on increasing her walking distance at home after discharge today.  Pt requires verbal cues for hip alignment with seated and standing hip/knee strength exercises.  Pt remains at a falls risk and has received fall prevention education.  Pt demonstrates uncontrolled descent with stand to sit transition.  Pt has HEP in place to address endurance, strength and  balance deficits.    PT Next Visit Plan  D/C PT to HEP    PT Home Exercise Plan  Access Code: [email or web address]    Consulted and Agree with Plan of Care  Patient       Patient will benefit from skilled therapeutic intervention in order to improve the following deficits and impairments:     Visit Diagnosis: Muscle weakness (generalized)  Other abnormalities of gait and mobility     Problem List There are no active problems to display for this patient.  PHYSICAL THERAPY DISCHARGE SUMMARY  Visits from Start of Care: 18  Current functional level related to goals / functional outcomes: See above for current status.     Remaining deficits: Pt has improved endurance, strength and balance although she is still at a risk for falls.  Pt has HEP in place to address remaining deficits.     Education / Equipment: HEP, fall prevention education Plan: Patient agrees to discharge.  Patient goals were partially met. Patient is being discharged due to being pleased with the current functional level.  ?????        Kathryn Snyder, PT 07/25/19 11:51 AM  [facility] [address] [place], [place], [address] Phone: [phone]   Fax:  [phone]  Name: Kathryn Snyder MRN: [id number] Date of Birth: 04/30/1941

## 2019-09-18 DIAGNOSIS — F331 Major depressive disorder, recurrent, moderate: Secondary | ICD-10-CM | POA: Diagnosis not present

## 2019-09-18 DIAGNOSIS — F411 Generalized anxiety disorder: Secondary | ICD-10-CM | POA: Diagnosis not present

## 2019-10-09 DIAGNOSIS — F411 Generalized anxiety disorder: Secondary | ICD-10-CM | POA: Diagnosis not present

## 2019-10-09 DIAGNOSIS — F331 Major depressive disorder, recurrent, moderate: Secondary | ICD-10-CM | POA: Diagnosis not present

## 2019-10-31 DIAGNOSIS — F411 Generalized anxiety disorder: Secondary | ICD-10-CM | POA: Diagnosis not present

## 2019-10-31 DIAGNOSIS — F331 Major depressive disorder, recurrent, moderate: Secondary | ICD-10-CM | POA: Diagnosis not present

## 2019-11-13 DIAGNOSIS — F411 Generalized anxiety disorder: Secondary | ICD-10-CM | POA: Diagnosis not present

## 2019-11-13 DIAGNOSIS — F331 Major depressive disorder, recurrent, moderate: Secondary | ICD-10-CM | POA: Diagnosis not present

## 2019-11-30 DIAGNOSIS — F411 Generalized anxiety disorder: Secondary | ICD-10-CM | POA: Diagnosis not present

## 2019-11-30 DIAGNOSIS — F331 Major depressive disorder, recurrent, moderate: Secondary | ICD-10-CM | POA: Diagnosis not present

## 2020-01-01 DIAGNOSIS — F411 Generalized anxiety disorder: Secondary | ICD-10-CM | POA: Diagnosis not present

## 2020-01-01 DIAGNOSIS — F331 Major depressive disorder, recurrent, moderate: Secondary | ICD-10-CM | POA: Diagnosis not present

## 2020-02-27 DIAGNOSIS — F411 Generalized anxiety disorder: Secondary | ICD-10-CM | POA: Diagnosis not present

## 2020-02-27 DIAGNOSIS — F331 Major depressive disorder, recurrent, moderate: Secondary | ICD-10-CM | POA: Diagnosis not present

## 2020-03-07 ENCOUNTER — Other Ambulatory Visit: Payer: Self-pay | Admitting: Family Medicine

## 2020-03-07 DIAGNOSIS — Z1231 Encounter for screening mammogram for malignant neoplasm of breast: Secondary | ICD-10-CM

## 2020-03-17 ENCOUNTER — Other Ambulatory Visit: Payer: Self-pay

## 2020-03-17 ENCOUNTER — Ambulatory Visit
Admission: RE | Admit: 2020-03-17 | Discharge: 2020-03-17 | Disposition: A | Discharge status: Home / Self Care | Payer: Medicare Other | Source: Ambulatory Visit | Attending: Family Medicine | Admitting: Family Medicine

## 2020-03-17 DIAGNOSIS — Z1231 Encounter for screening mammogram for malignant neoplasm of breast: Secondary | ICD-10-CM | POA: Diagnosis not present

## 2020-03-26 DIAGNOSIS — F331 Major depressive disorder, recurrent, moderate: Secondary | ICD-10-CM | POA: Diagnosis not present

## 2020-03-26 DIAGNOSIS — F411 Generalized anxiety disorder: Secondary | ICD-10-CM | POA: Diagnosis not present

## 2020-04-16 DIAGNOSIS — F331 Major depressive disorder, recurrent, moderate: Secondary | ICD-10-CM | POA: Diagnosis not present

## 2020-04-16 DIAGNOSIS — F411 Generalized anxiety disorder: Secondary | ICD-10-CM | POA: Diagnosis not present

## 2020-05-14 DIAGNOSIS — F411 Generalized anxiety disorder: Secondary | ICD-10-CM | POA: Diagnosis not present

## 2020-05-14 DIAGNOSIS — F331 Major depressive disorder, recurrent, moderate: Secondary | ICD-10-CM | POA: Diagnosis not present

## 2020-05-19 DIAGNOSIS — F331 Major depressive disorder, recurrent, moderate: Secondary | ICD-10-CM | POA: Diagnosis not present

## 2020-05-19 IMAGING — MG DIGITAL DIAGNOSTIC UNILATERAL RIGHT MAMMOGRAM WITH TOMO AND CAD
4 series · 4 of 12 positions shown · non-contrast
Comparison: Previous exam(s).

CLINICAL DATA: Recall from screening mammography with
tomosynthesis, possible subtle architectural distortion visible only
on the CC images.Family history is unknown as she was adopted.

EXAM:
DIGITAL DIAGNOSTIC UNILATERAL RIGHT MAMMOGRAM WITH CAD AND TOMO

[R ML synth-2D]
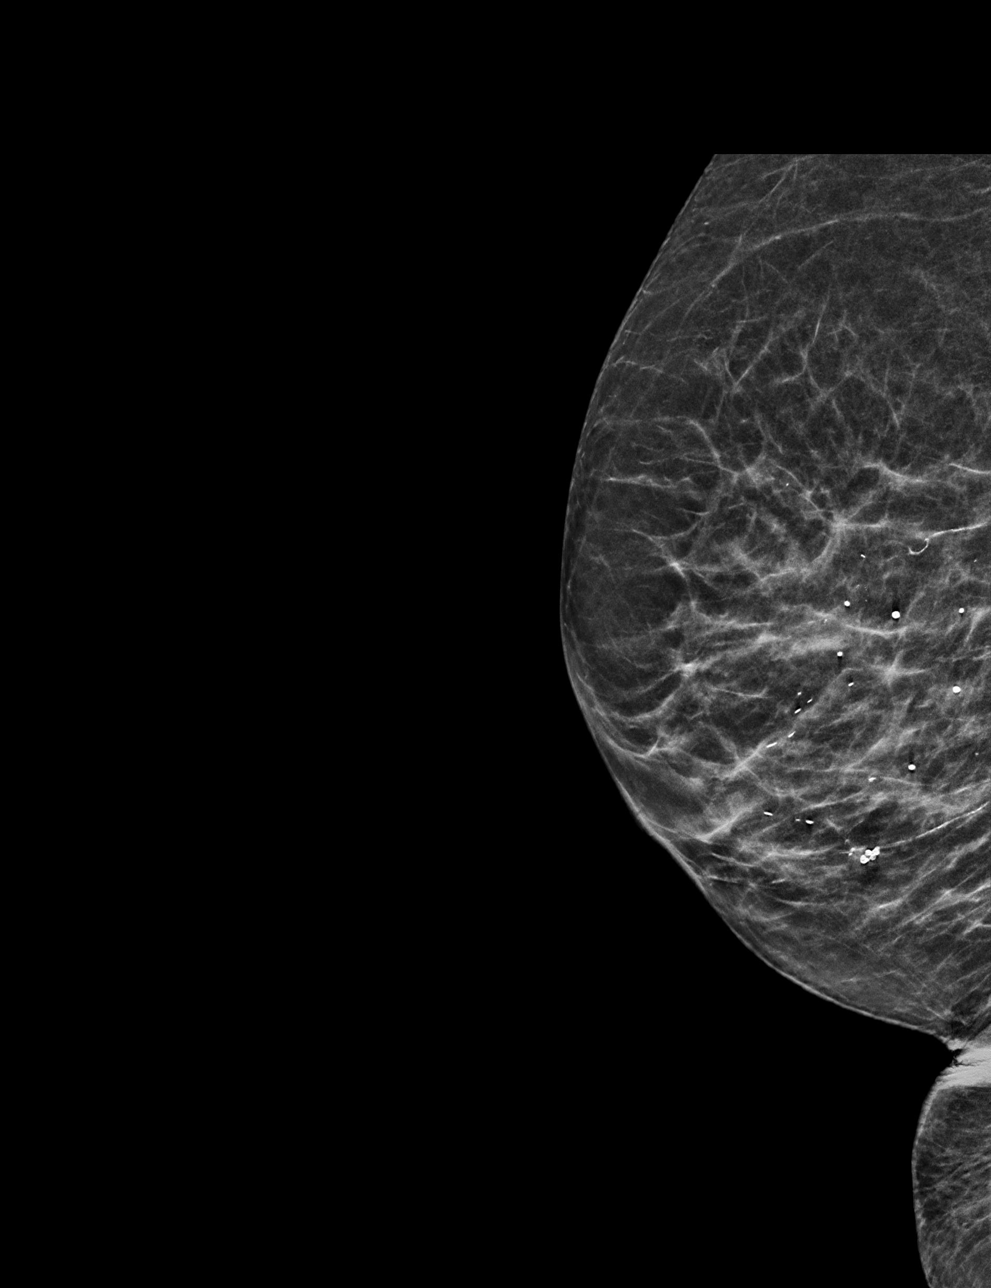

[R CC synth-2D]
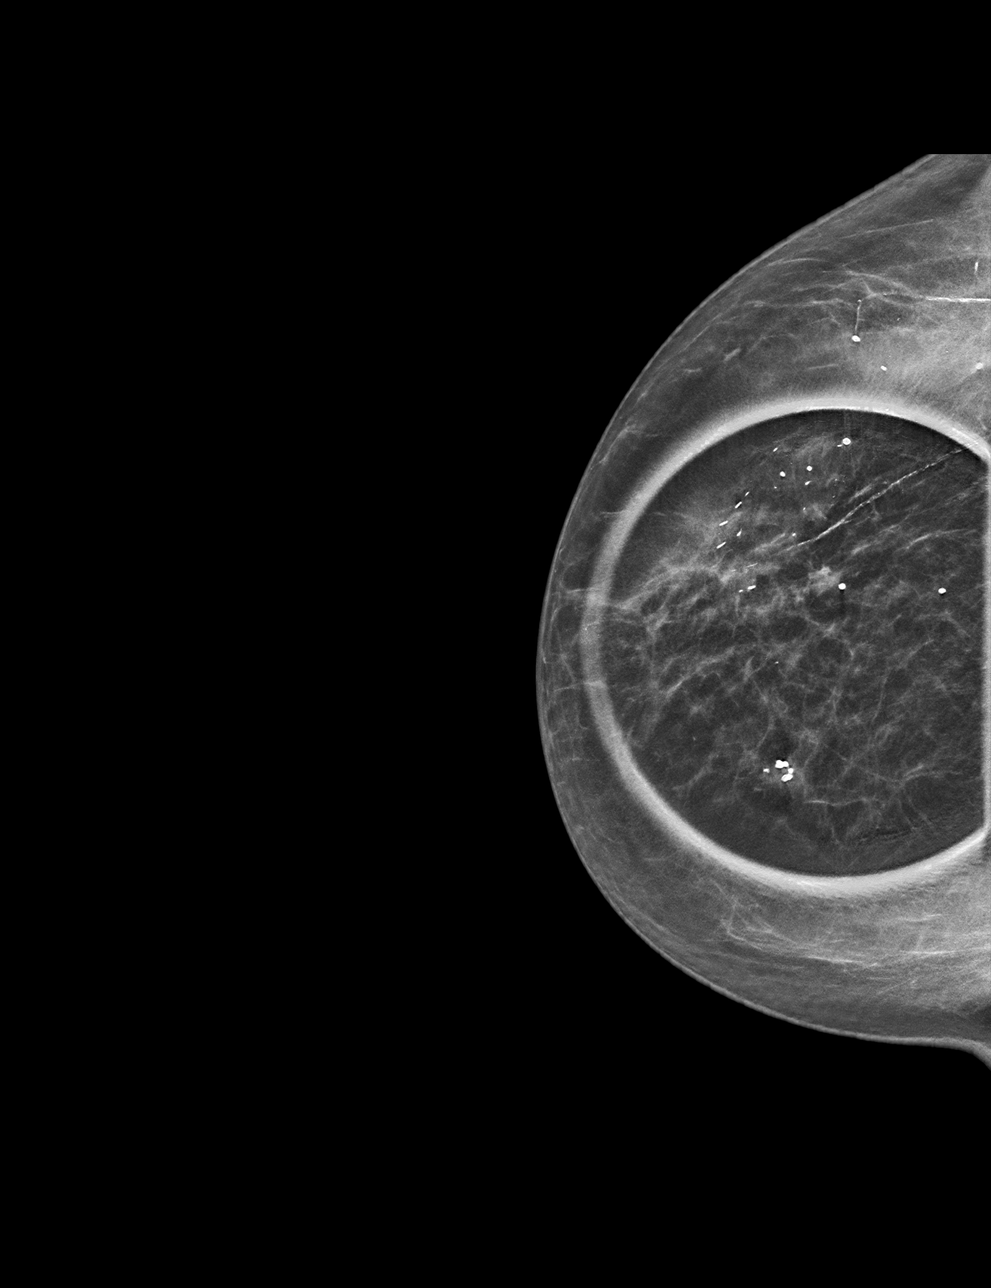

[R ML tomo · tomo slice 23/45.0]
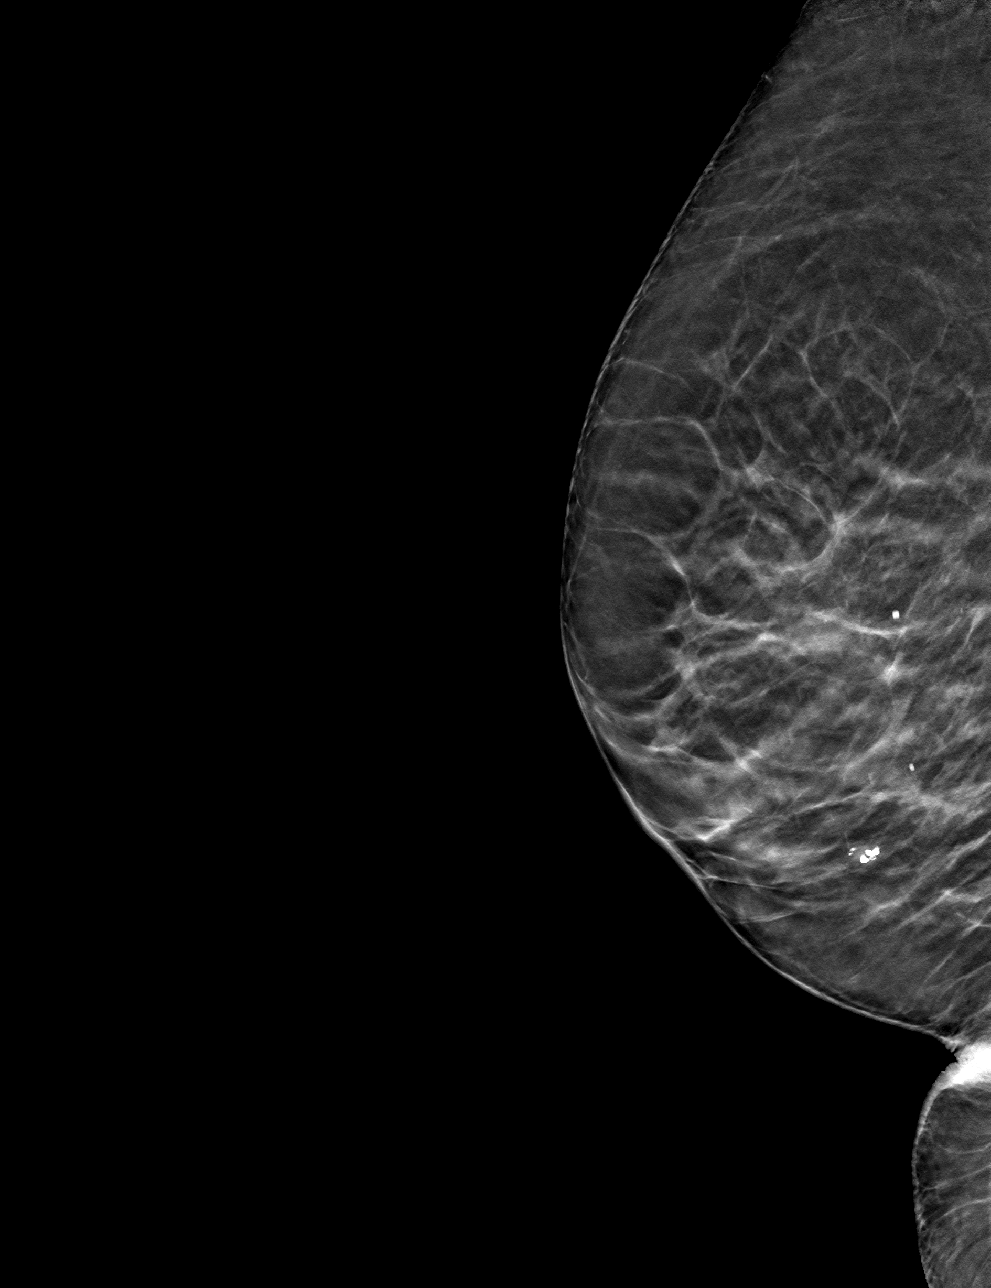

[R CC tomo · tomo slice 25/48.0]
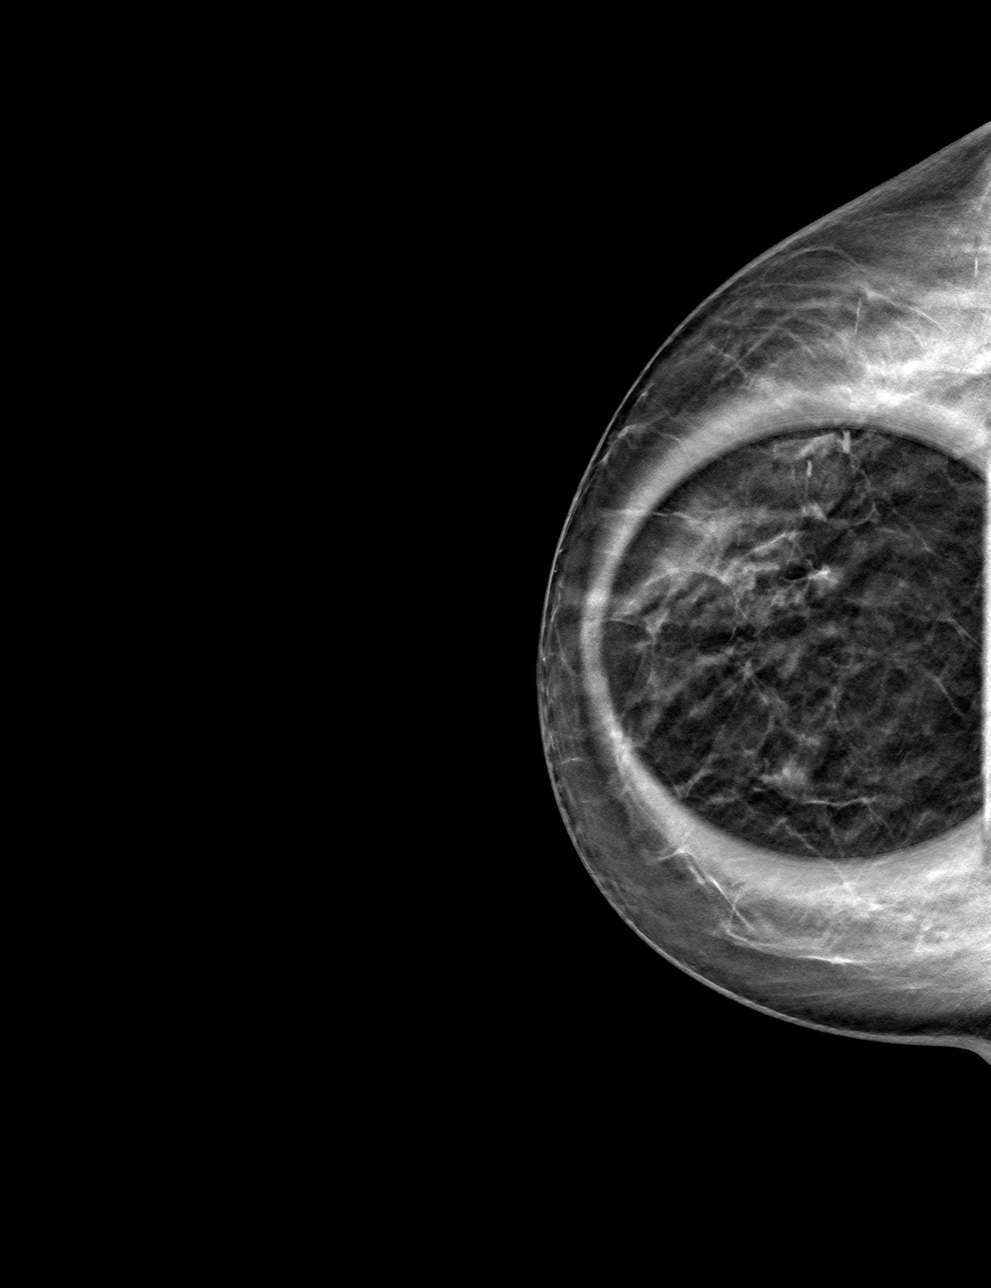

[4 of 12 positions shown; findings below may reference images not displayed]

ACR Breast Density Category b: There are scattered areas of
fibroglandular density.
FINDINGS: Tomosynthesis and synthesized spot-compression CC view of the area
of concern in the RIGHT breast and a tomosynthesis and synthesized
full field mediolateral view of the RIGHT breast were obtained.

No persistent architectural distortion on the spot compression
images. Overlapping fibroglandular tissue and normal Cooper's
ligaments are present in this location.

No findings suspicious for malignancy in the RIGHT breast.

Mammographic images were processed with CAD.
IMPRESSION: 1. No mammographic evidence of malignancy involving the RIGHT
breast.
2. Overlapping normal fibroglandular tissue and Cooper's ligaments
account for the screening mammographic finding.

RECOMMENDATION:
Screening mammogram in one year.(Code:[id number])

I have discussed the findings and recommendations with the patient.
If applicable, a reminder letter will be sent to the patient
regarding the next appointment.

BI-RADS CATEGORY  1: Negative.

## 2020-05-23 DIAGNOSIS — Z23 Encounter for immunization: Secondary | ICD-10-CM | POA: Diagnosis not present

## 2020-05-26 DIAGNOSIS — E559 Vitamin D deficiency, unspecified: Secondary | ICD-10-CM | POA: Diagnosis not present

## 2020-05-26 DIAGNOSIS — E7800 Pure hypercholesterolemia, unspecified: Secondary | ICD-10-CM | POA: Diagnosis not present

## 2020-05-26 DIAGNOSIS — Z79899 Other long term (current) drug therapy: Secondary | ICD-10-CM | POA: Diagnosis not present

## 2020-05-29 DIAGNOSIS — R32 Unspecified urinary incontinence: Secondary | ICD-10-CM | POA: Diagnosis not present

## 2020-05-29 DIAGNOSIS — E559 Vitamin D deficiency, unspecified: Secondary | ICD-10-CM | POA: Diagnosis not present

## 2020-05-29 DIAGNOSIS — G250 Essential tremor: Secondary | ICD-10-CM | POA: Diagnosis not present

## 2020-05-29 DIAGNOSIS — D692 Other nonthrombocytopenic purpura: Secondary | ICD-10-CM | POA: Diagnosis not present

## 2020-05-29 DIAGNOSIS — I4892 Unspecified atrial flutter: Secondary | ICD-10-CM | POA: Diagnosis not present

## 2020-05-29 DIAGNOSIS — Z79899 Other long term (current) drug therapy: Secondary | ICD-10-CM | POA: Diagnosis not present

## 2020-05-29 DIAGNOSIS — E7800 Pure hypercholesterolemia, unspecified: Secondary | ICD-10-CM | POA: Diagnosis not present

## 2020-05-29 DIAGNOSIS — Z0000 Encounter for general adult medical examination without abnormal findings: Secondary | ICD-10-CM | POA: Diagnosis not present

## 2020-05-29 DIAGNOSIS — F3342 Major depressive disorder, recurrent, in full remission: Secondary | ICD-10-CM | POA: Diagnosis not present

## 2020-05-29 DIAGNOSIS — I10 Essential (primary) hypertension: Secondary | ICD-10-CM | POA: Diagnosis not present

## 2020-06-10 DIAGNOSIS — F331 Major depressive disorder, recurrent, moderate: Secondary | ICD-10-CM | POA: Diagnosis not present

## 2020-06-10 DIAGNOSIS — F411 Generalized anxiety disorder: Secondary | ICD-10-CM | POA: Diagnosis not present

## 2020-06-18 DIAGNOSIS — G453 Amaurosis fugax: Secondary | ICD-10-CM | POA: Diagnosis not present

## 2020-06-19 ENCOUNTER — Other Ambulatory Visit: Payer: Self-pay | Admitting: Family Medicine

## 2020-06-19 ENCOUNTER — Other Ambulatory Visit (HOSPITAL_COMMUNITY): Payer: Self-pay | Admitting: Family Medicine

## 2020-06-19 DIAGNOSIS — G453 Amaurosis fugax: Secondary | ICD-10-CM

## 2020-06-20 ENCOUNTER — Other Ambulatory Visit: Payer: Self-pay | Admitting: Family Medicine

## 2020-06-20 DIAGNOSIS — G453 Amaurosis fugax: Secondary | ICD-10-CM

## 2020-07-15 ENCOUNTER — Ambulatory Visit
Admission: RE | Admit: 2020-07-15 | Discharge: 2020-07-15 | Disposition: A | Discharge status: Home / Self Care | Payer: Medicare Other | Source: Ambulatory Visit | Attending: Family Medicine | Admitting: Family Medicine

## 2020-07-15 ENCOUNTER — Other Ambulatory Visit: Payer: Self-pay

## 2020-07-15 DIAGNOSIS — G453 Amaurosis fugax: Secondary | ICD-10-CM

## 2020-07-15 DIAGNOSIS — I6522 Occlusion and stenosis of left carotid artery: Secondary | ICD-10-CM | POA: Diagnosis not present

## 2020-07-15 DIAGNOSIS — Z8673 Personal history of transient ischemic attack (TIA), and cerebral infarction without residual deficits: Secondary | ICD-10-CM | POA: Diagnosis not present

## 2020-07-15 DIAGNOSIS — R251 Tremor, unspecified: Secondary | ICD-10-CM | POA: Diagnosis not present

## 2020-07-15 MED ORDER — GADOBENATE DIMEGLUMINE 529 MG/ML IV SOLN
10.0000 mL | Freq: Once | INTRAVENOUS | Status: AC | PRN
  Administered 2020-07-15: 10 mL via INTRAVENOUS

## 2020-07-16 ENCOUNTER — Ambulatory Visit (HOSPITAL_COMMUNITY): Payer: Medicare Other | Attending: Cardiovascular Disease

## 2020-07-16 DIAGNOSIS — G453 Amaurosis fugax: Secondary | ICD-10-CM | POA: Insufficient documentation

## 2020-07-16 LAB — ECHOCARDIOGRAM COMPLETE
Area-P 1/2: 2.66 cm2
P 1/2 time: 595 msec
S' Lateral: 2.3 cm

## 2020-07-29 DIAGNOSIS — I10 Essential (primary) hypertension: Secondary | ICD-10-CM | POA: Diagnosis not present

## 2020-07-29 DIAGNOSIS — E7800 Pure hypercholesterolemia, unspecified: Secondary | ICD-10-CM | POA: Diagnosis not present

## 2020-07-31 DIAGNOSIS — F411 Generalized anxiety disorder: Secondary | ICD-10-CM | POA: Diagnosis not present

## 2020-07-31 DIAGNOSIS — F331 Major depressive disorder, recurrent, moderate: Secondary | ICD-10-CM | POA: Diagnosis not present

## 2020-09-17 DIAGNOSIS — F331 Major depressive disorder, recurrent, moderate: Secondary | ICD-10-CM | POA: Diagnosis not present

## 2020-09-17 DIAGNOSIS — F411 Generalized anxiety disorder: Secondary | ICD-10-CM | POA: Diagnosis not present

## 2020-10-08 DIAGNOSIS — F331 Major depressive disorder, recurrent, moderate: Secondary | ICD-10-CM | POA: Diagnosis not present

## 2020-10-15 DIAGNOSIS — F331 Major depressive disorder, recurrent, moderate: Secondary | ICD-10-CM | POA: Diagnosis not present

## 2020-11-19 DIAGNOSIS — F411 Generalized anxiety disorder: Secondary | ICD-10-CM | POA: Diagnosis not present

## 2020-11-19 DIAGNOSIS — F331 Major depressive disorder, recurrent, moderate: Secondary | ICD-10-CM | POA: Diagnosis not present

## 2020-12-09 DIAGNOSIS — F331 Major depressive disorder, recurrent, moderate: Secondary | ICD-10-CM | POA: Diagnosis not present

## 2020-12-18 DIAGNOSIS — F411 Generalized anxiety disorder: Secondary | ICD-10-CM | POA: Diagnosis not present

## 2020-12-18 DIAGNOSIS — F331 Major depressive disorder, recurrent, moderate: Secondary | ICD-10-CM | POA: Diagnosis not present

## 2020-12-22 DIAGNOSIS — Z8673 Personal history of transient ischemic attack (TIA), and cerebral infarction without residual deficits: Secondary | ICD-10-CM | POA: Diagnosis not present

## 2020-12-22 DIAGNOSIS — R7301 Impaired fasting glucose: Secondary | ICD-10-CM | POA: Diagnosis not present

## 2020-12-22 DIAGNOSIS — I6522 Occlusion and stenosis of left carotid artery: Secondary | ICD-10-CM | POA: Diagnosis not present

## 2021-01-15 DIAGNOSIS — F411 Generalized anxiety disorder: Secondary | ICD-10-CM | POA: Diagnosis not present

## 2021-01-15 DIAGNOSIS — F331 Major depressive disorder, recurrent, moderate: Secondary | ICD-10-CM | POA: Diagnosis not present

## 2021-02-13 ENCOUNTER — Other Ambulatory Visit: Payer: Self-pay | Admitting: Family Medicine

## 2021-02-13 DIAGNOSIS — Z1231 Encounter for screening mammogram for malignant neoplasm of breast: Secondary | ICD-10-CM

## 2021-02-19 ENCOUNTER — Other Ambulatory Visit: Payer: Self-pay | Admitting: Family Medicine

## 2021-02-19 DIAGNOSIS — F331 Major depressive disorder, recurrent, moderate: Secondary | ICD-10-CM | POA: Diagnosis not present

## 2021-02-19 DIAGNOSIS — E2839 Other primary ovarian failure: Secondary | ICD-10-CM

## 2021-02-19 DIAGNOSIS — F411 Generalized anxiety disorder: Secondary | ICD-10-CM | POA: Diagnosis not present

## 2021-03-11 DIAGNOSIS — F331 Major depressive disorder, recurrent, moderate: Secondary | ICD-10-CM | POA: Diagnosis not present

## 2021-03-23 DIAGNOSIS — F331 Major depressive disorder, recurrent, moderate: Secondary | ICD-10-CM | POA: Diagnosis not present

## 2021-03-23 DIAGNOSIS — F411 Generalized anxiety disorder: Secondary | ICD-10-CM | POA: Diagnosis not present

## 2021-04-13 ENCOUNTER — Ambulatory Visit
Admission: RE | Admit: 2021-04-13 | Discharge: 2021-04-13 | Disposition: A | Discharge status: Home / Self Care | Payer: Medicare Other | Source: Ambulatory Visit | Attending: Family Medicine | Admitting: Family Medicine

## 2021-04-13 ENCOUNTER — Other Ambulatory Visit: Payer: Self-pay

## 2021-04-13 DIAGNOSIS — Z1231 Encounter for screening mammogram for malignant neoplasm of breast: Secondary | ICD-10-CM

## 2021-04-16 DIAGNOSIS — F331 Major depressive disorder, recurrent, moderate: Secondary | ICD-10-CM | POA: Diagnosis not present

## 2021-05-07 DIAGNOSIS — F411 Generalized anxiety disorder: Secondary | ICD-10-CM | POA: Diagnosis not present

## 2021-05-07 DIAGNOSIS — F331 Major depressive disorder, recurrent, moderate: Secondary | ICD-10-CM | POA: Diagnosis not present

## 2021-05-18 DIAGNOSIS — F331 Major depressive disorder, recurrent, moderate: Secondary | ICD-10-CM | POA: Diagnosis not present

## 2021-06-02 DIAGNOSIS — F331 Major depressive disorder, recurrent, moderate: Secondary | ICD-10-CM | POA: Diagnosis not present

## 2021-06-15 DIAGNOSIS — H1820 Unspecified corneal edema: Secondary | ICD-10-CM | POA: Diagnosis not present

## 2021-06-15 DIAGNOSIS — Z961 Presence of intraocular lens: Secondary | ICD-10-CM | POA: Diagnosis not present

## 2021-06-15 DIAGNOSIS — H40052 Ocular hypertension, left eye: Secondary | ICD-10-CM | POA: Diagnosis not present

## 2021-06-15 DIAGNOSIS — H524 Presbyopia: Secondary | ICD-10-CM | POA: Diagnosis not present

## 2021-06-18 DIAGNOSIS — E559 Vitamin D deficiency, unspecified: Secondary | ICD-10-CM | POA: Diagnosis not present

## 2021-06-18 DIAGNOSIS — I6522 Occlusion and stenosis of left carotid artery: Secondary | ICD-10-CM | POA: Diagnosis not present

## 2021-06-18 DIAGNOSIS — E538 Deficiency of other specified B group vitamins: Secondary | ICD-10-CM | POA: Diagnosis not present

## 2021-06-18 DIAGNOSIS — Z23 Encounter for immunization: Secondary | ICD-10-CM | POA: Diagnosis not present

## 2021-06-18 DIAGNOSIS — M810 Age-related osteoporosis without current pathological fracture: Secondary | ICD-10-CM | POA: Diagnosis not present

## 2021-06-18 DIAGNOSIS — I4892 Unspecified atrial flutter: Secondary | ICD-10-CM | POA: Diagnosis not present

## 2021-06-18 DIAGNOSIS — I10 Essential (primary) hypertension: Secondary | ICD-10-CM | POA: Diagnosis not present

## 2021-06-18 DIAGNOSIS — F322 Major depressive disorder, single episode, severe without psychotic features: Secondary | ICD-10-CM | POA: Diagnosis not present

## 2021-06-18 DIAGNOSIS — R7303 Prediabetes: Secondary | ICD-10-CM | POA: Diagnosis not present

## 2021-06-18 DIAGNOSIS — Z0000 Encounter for general adult medical examination without abnormal findings: Secondary | ICD-10-CM | POA: Diagnosis not present

## 2021-06-18 DIAGNOSIS — E7800 Pure hypercholesterolemia, unspecified: Secondary | ICD-10-CM | POA: Diagnosis not present

## 2021-06-18 DIAGNOSIS — Z79899 Other long term (current) drug therapy: Secondary | ICD-10-CM | POA: Diagnosis not present

## 2021-06-29 DIAGNOSIS — F331 Major depressive disorder, recurrent, moderate: Secondary | ICD-10-CM | POA: Diagnosis not present

## 2021-06-29 DIAGNOSIS — F411 Generalized anxiety disorder: Secondary | ICD-10-CM | POA: Diagnosis not present

## 2021-07-06 DIAGNOSIS — H1820 Unspecified corneal edema: Secondary | ICD-10-CM | POA: Diagnosis not present

## 2021-07-06 DIAGNOSIS — H40052 Ocular hypertension, left eye: Secondary | ICD-10-CM | POA: Diagnosis not present

## 2021-07-27 DIAGNOSIS — F331 Major depressive disorder, recurrent, moderate: Secondary | ICD-10-CM | POA: Diagnosis not present

## 2021-07-28 DIAGNOSIS — F411 Generalized anxiety disorder: Secondary | ICD-10-CM | POA: Diagnosis not present

## 2021-07-28 DIAGNOSIS — F331 Major depressive disorder, recurrent, moderate: Secondary | ICD-10-CM | POA: Diagnosis not present

## 2021-08-19 ENCOUNTER — Ambulatory Visit
Admission: RE | Admit: 2021-08-19 | Discharge: 2021-08-19 | Disposition: A | Discharge status: Home / Self Care | Payer: Medicare Other | Source: Ambulatory Visit | Attending: Family Medicine | Admitting: Family Medicine

## 2021-08-19 DIAGNOSIS — E2839 Other primary ovarian failure: Secondary | ICD-10-CM

## 2021-08-19 DIAGNOSIS — M810 Age-related osteoporosis without current pathological fracture: Secondary | ICD-10-CM | POA: Diagnosis not present

## 2021-08-19 DIAGNOSIS — Z780 Asymptomatic menopausal state: Secondary | ICD-10-CM | POA: Diagnosis not present

## 2021-08-25 DIAGNOSIS — F331 Major depressive disorder, recurrent, moderate: Secondary | ICD-10-CM | POA: Diagnosis not present

## 2021-09-15 DIAGNOSIS — F331 Major depressive disorder, recurrent, moderate: Secondary | ICD-10-CM | POA: Diagnosis not present

## 2021-09-23 DIAGNOSIS — F411 Generalized anxiety disorder: Secondary | ICD-10-CM | POA: Diagnosis not present

## 2021-09-23 DIAGNOSIS — F331 Major depressive disorder, recurrent, moderate: Secondary | ICD-10-CM | POA: Diagnosis not present

## 2021-10-14 DIAGNOSIS — F331 Major depressive disorder, recurrent, moderate: Secondary | ICD-10-CM | POA: Diagnosis not present

## 2021-10-14 DIAGNOSIS — F411 Generalized anxiety disorder: Secondary | ICD-10-CM | POA: Diagnosis not present

## 2021-10-17 IMAGING — US US CAROTID DUPLEX BILAT
1 series · 13 of 24 positions shown · non-contrast
Comparison: None.

CLINICAL DATA: Amaurosis fugax

EXAM:
BILATERAL CAROTID DUPLEX ULTRASOUND
TECHNIQUE: Gray scale imaging, color Doppler and duplex ultrasound were
performed of bilateral carotid and vertebral arteries in the neck.

[Series 1: us carotid duplex bilat · 0.07mm/px · 13 of 64 slices shown]
[im 1/64]
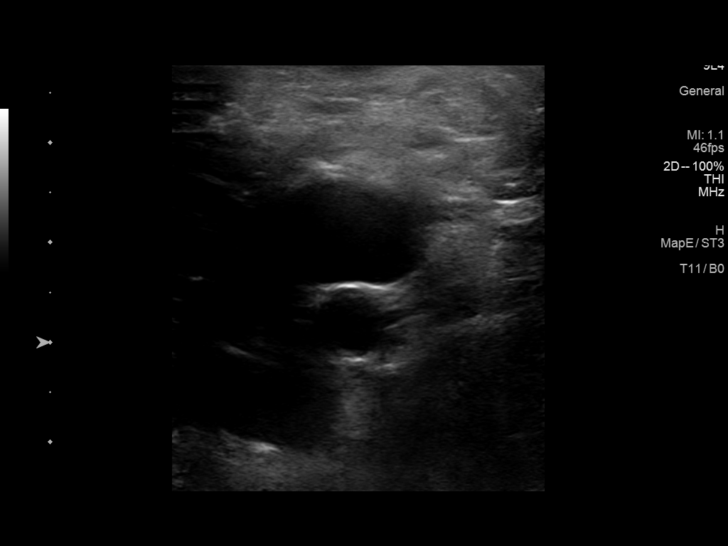
[im 6/64]
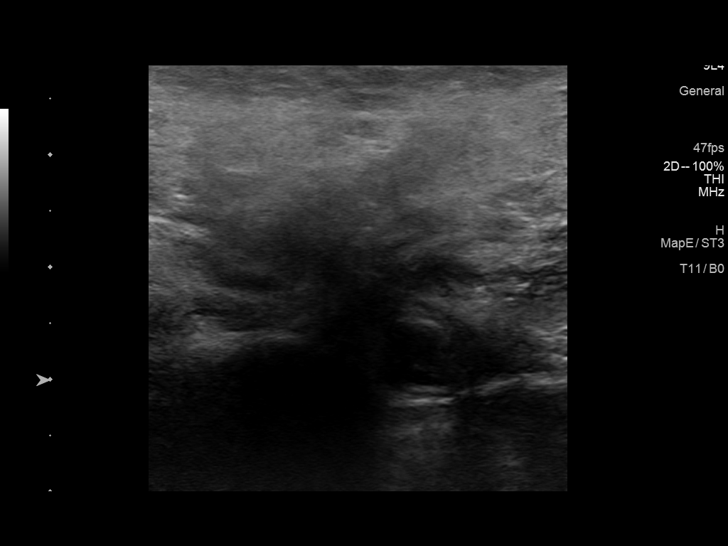
[im 11/64]
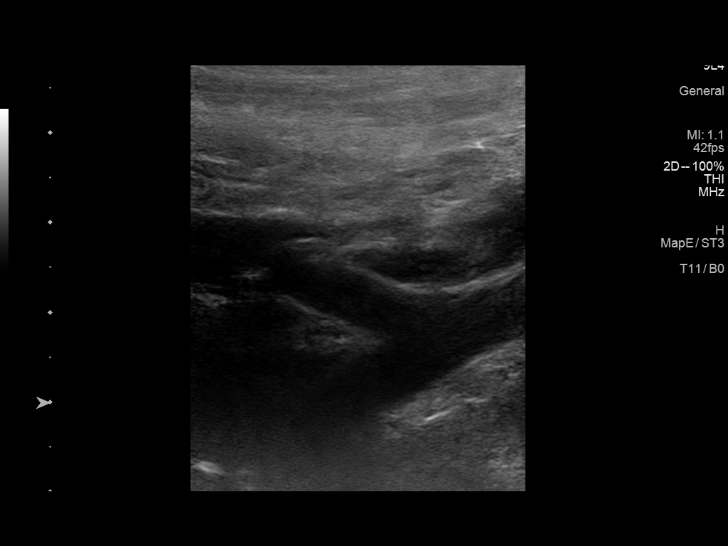
[im 17/64]
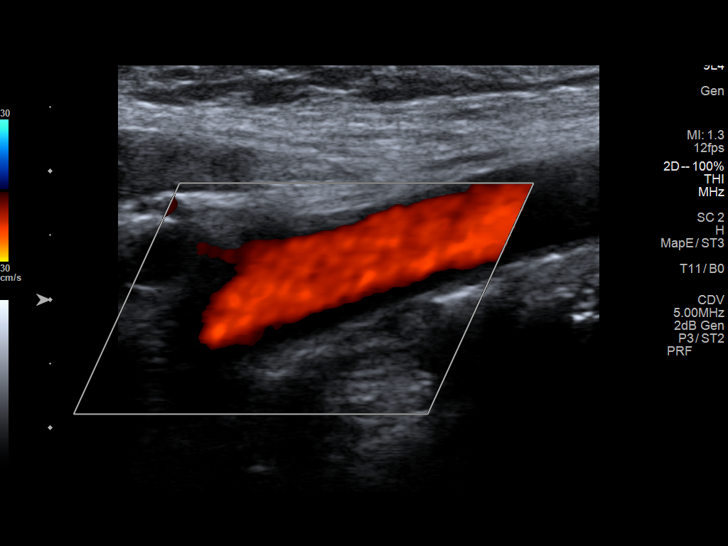
[im 22/64]
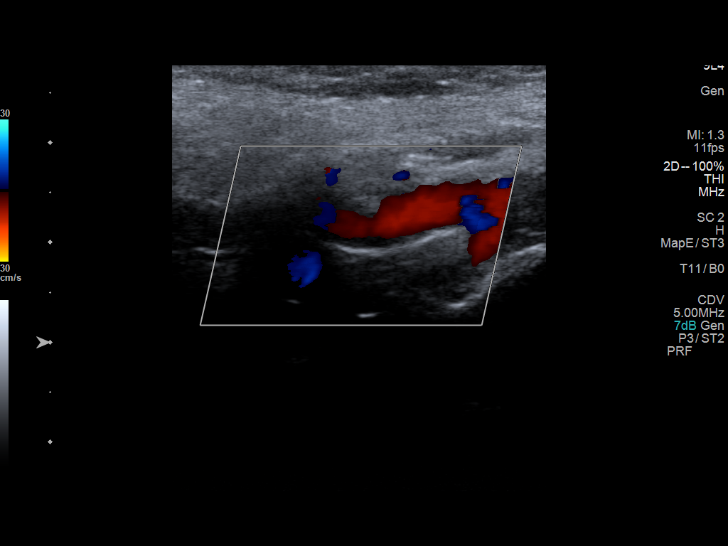
[im 28/64]
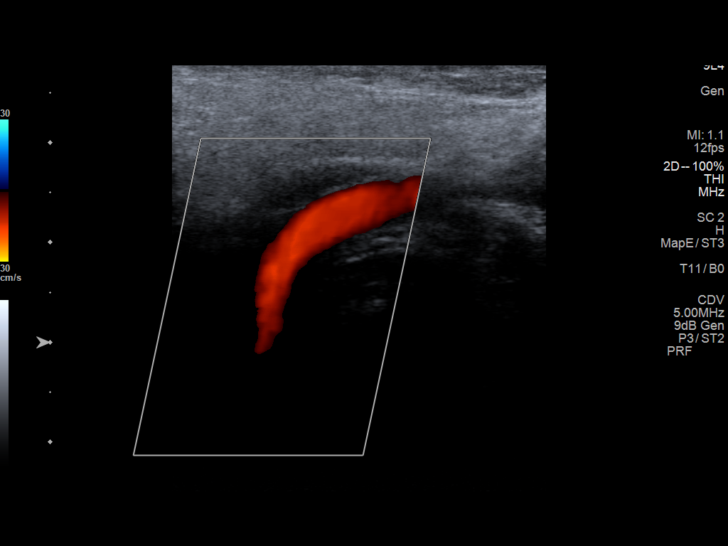
[im 33/64]
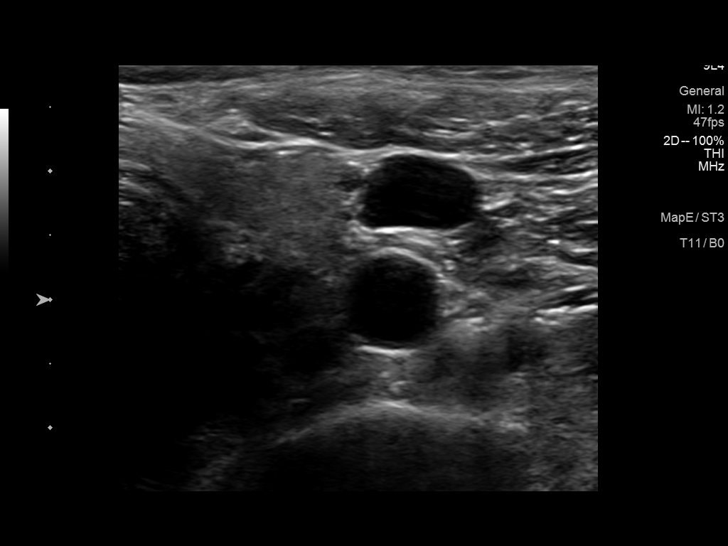
[im 36/64]
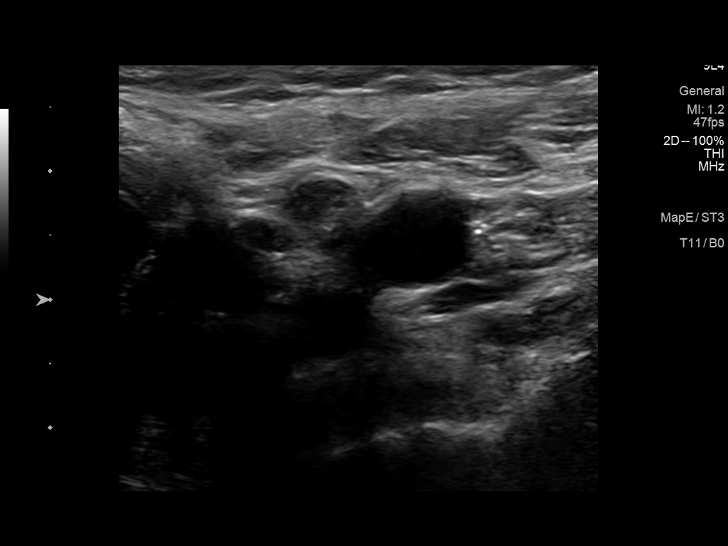
[im 42/64]
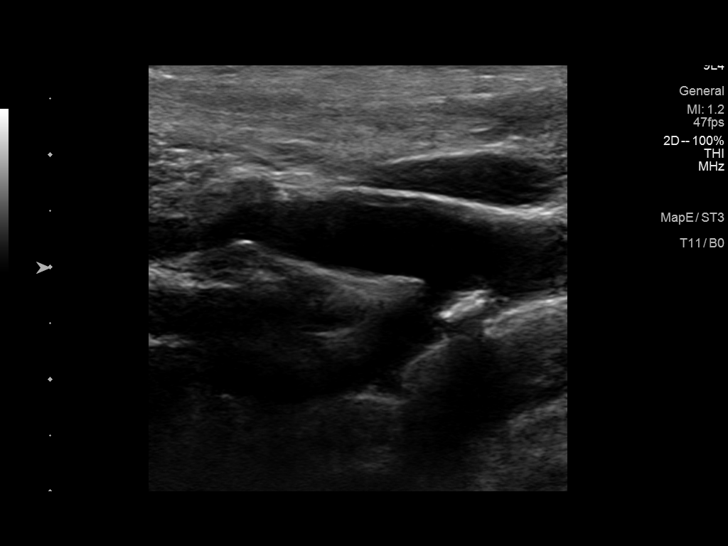
[im 47/64]
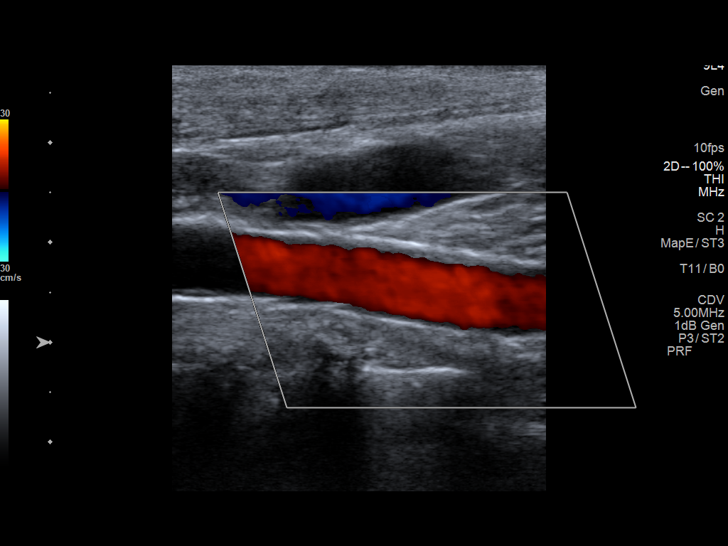
[im 53/64]
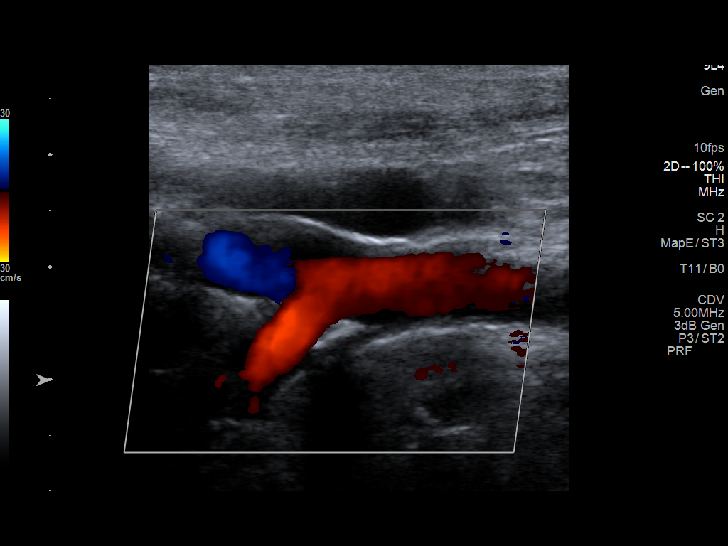
[im 58/64]
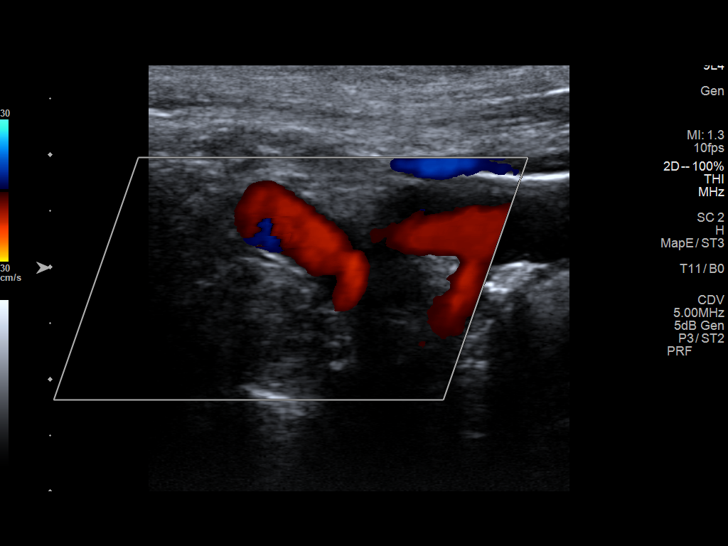
[im 64/64]
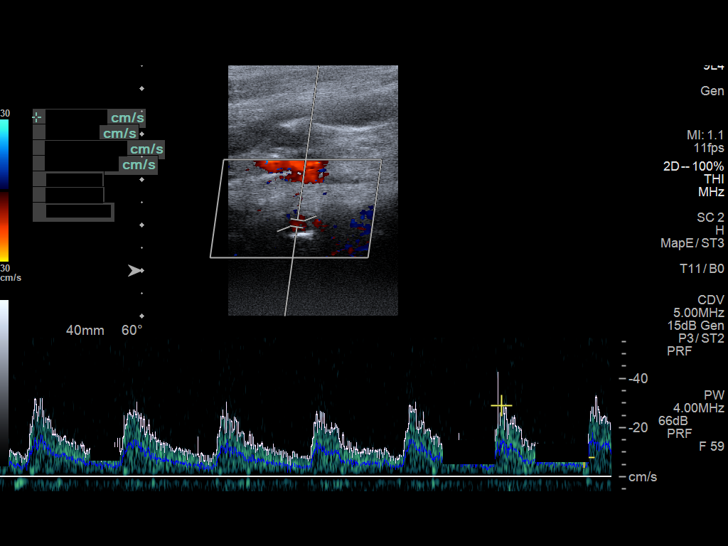

[13 of 24 positions shown; findings below may reference images not displayed]

FINDINGS: Criteria: Quantification of carotid stenosis is based on velocity
parameters that correlate the residual internal carotid diameter
with NASCET-based stenosis levels, using the diameter of the distal
internal carotid lumen as the denominator for stenosis measurement.

The following velocity measurements were obtained:

RIGHT
ICA: 65/21 cm/sec
CCA: 69/14 cm/sec

SYSTOLIC ICA/CCA RATIO:

ECA:  39 cm/sec

LEFT

ICA: 65/22 cm/sec

CCA: 55/11 cm/sec

SYSTOLIC ICA/CCA RATIO:

ECA:  39 cm/sec

RIGHT CAROTID ARTERY: No significant atherosclerotic plaque or
evidence of stenosis in the internal carotid artery.

RIGHT VERTEBRAL ARTERY:  Patent with normal antegrade flow.

LEFT CAROTID ARTERY: Mild heterogeneous plaque in the proximal
internal carotid artery. By peak systolic velocity criteria, the
estimated stenosis is less than 50%.

LEFT VERTEBRAL ARTERY:  Patent with normal antegrade flow.
IMPRESSION: 1. Mild (1-49%) stenosis proximal left internal carotid artery
secondary to mild heterogeneous atherosclerotic plaque.
2. No significant plaque or evidence of stenosis in the right
internal carotid artery.
3. Vertebral arteries are patent with normal antegrade flow.

## 2021-11-09 DIAGNOSIS — F331 Major depressive disorder, recurrent, moderate: Secondary | ICD-10-CM | POA: Diagnosis not present

## 2021-11-16 DIAGNOSIS — G250 Essential tremor: Secondary | ICD-10-CM | POA: Diagnosis not present

## 2021-11-16 DIAGNOSIS — R269 Unspecified abnormalities of gait and mobility: Secondary | ICD-10-CM | POA: Diagnosis not present

## 2021-11-16 DIAGNOSIS — Z8673 Personal history of transient ischemic attack (TIA), and cerebral infarction without residual deficits: Secondary | ICD-10-CM | POA: Diagnosis not present

## 2021-11-16 DIAGNOSIS — R7303 Prediabetes: Secondary | ICD-10-CM | POA: Diagnosis not present

## 2021-11-16 DIAGNOSIS — F3342 Major depressive disorder, recurrent, in full remission: Secondary | ICD-10-CM | POA: Diagnosis not present

## 2021-12-07 DIAGNOSIS — F331 Major depressive disorder, recurrent, moderate: Secondary | ICD-10-CM | POA: Diagnosis not present

## 2021-12-07 DIAGNOSIS — F411 Generalized anxiety disorder: Secondary | ICD-10-CM | POA: Diagnosis not present

## 2021-12-28 DIAGNOSIS — Z111 Encounter for screening for respiratory tuberculosis: Secondary | ICD-10-CM | POA: Diagnosis not present

## 2022-01-04 DIAGNOSIS — H1820 Unspecified corneal edema: Secondary | ICD-10-CM | POA: Diagnosis not present

## 2022-01-04 DIAGNOSIS — H40052 Ocular hypertension, left eye: Secondary | ICD-10-CM | POA: Diagnosis not present

## 2022-01-13 DIAGNOSIS — F331 Major depressive disorder, recurrent, moderate: Secondary | ICD-10-CM | POA: Diagnosis not present

## 2022-02-08 DIAGNOSIS — F331 Major depressive disorder, recurrent, moderate: Secondary | ICD-10-CM | POA: Diagnosis not present

## 2022-02-08 DIAGNOSIS — F411 Generalized anxiety disorder: Secondary | ICD-10-CM | POA: Diagnosis not present

## 2022-02-09 DIAGNOSIS — F331 Major depressive disorder, recurrent, moderate: Secondary | ICD-10-CM | POA: Diagnosis not present

## 2022-03-08 DIAGNOSIS — F331 Major depressive disorder, recurrent, moderate: Secondary | ICD-10-CM | POA: Diagnosis not present

## 2022-03-23 DIAGNOSIS — L821 Other seborrheic keratosis: Secondary | ICD-10-CM | POA: Diagnosis not present

## 2022-03-23 DIAGNOSIS — D485 Neoplasm of uncertain behavior of skin: Secondary | ICD-10-CM | POA: Diagnosis not present

## 2022-04-06 DIAGNOSIS — F331 Major depressive disorder, recurrent, moderate: Secondary | ICD-10-CM | POA: Diagnosis not present

## 2022-04-06 DIAGNOSIS — F411 Generalized anxiety disorder: Secondary | ICD-10-CM | POA: Diagnosis not present

## 2022-04-30 ENCOUNTER — Other Ambulatory Visit: Payer: Self-pay | Admitting: Family Medicine

## 2022-04-30 DIAGNOSIS — Z1231 Encounter for screening mammogram for malignant neoplasm of breast: Secondary | ICD-10-CM

## 2022-05-10 DIAGNOSIS — F331 Major depressive disorder, recurrent, moderate: Secondary | ICD-10-CM | POA: Diagnosis not present

## 2022-05-10 DIAGNOSIS — F411 Generalized anxiety disorder: Secondary | ICD-10-CM | POA: Diagnosis not present

## 2022-05-20 ENCOUNTER — Ambulatory Visit: Payer: Medicare Other

## 2022-05-20 ENCOUNTER — Ambulatory Visit
Admission: RE | Admit: 2022-05-20 | Discharge: 2022-05-20 | Disposition: A | Discharge status: Home / Self Care | Payer: Medicare Other | Source: Ambulatory Visit | Attending: Family Medicine | Admitting: Family Medicine

## 2022-05-20 DIAGNOSIS — Z1231 Encounter for screening mammogram for malignant neoplasm of breast: Secondary | ICD-10-CM | POA: Diagnosis not present

## 2022-05-24 ENCOUNTER — Other Ambulatory Visit: Payer: Self-pay | Admitting: Family Medicine

## 2022-05-24 DIAGNOSIS — R928 Other abnormal and inconclusive findings on diagnostic imaging of breast: Secondary | ICD-10-CM

## 2022-06-01 ENCOUNTER — Ambulatory Visit: Payer: Medicare Other

## 2022-06-01 ENCOUNTER — Ambulatory Visit
Admission: RE | Admit: 2022-06-01 | Discharge: 2022-06-01 | Disposition: A | Discharge status: Home / Self Care | Payer: Medicare Other | Source: Ambulatory Visit | Attending: Family Medicine | Admitting: Family Medicine

## 2022-06-01 DIAGNOSIS — N6489 Other specified disorders of breast: Secondary | ICD-10-CM | POA: Diagnosis not present

## 2022-06-01 DIAGNOSIS — R928 Other abnormal and inconclusive findings on diagnostic imaging of breast: Secondary | ICD-10-CM

## 2022-06-09 DIAGNOSIS — H1820 Unspecified corneal edema: Secondary | ICD-10-CM | POA: Diagnosis not present

## 2022-06-09 DIAGNOSIS — H43393 Other vitreous opacities, bilateral: Secondary | ICD-10-CM | POA: Diagnosis not present

## 2022-06-09 DIAGNOSIS — Z961 Presence of intraocular lens: Secondary | ICD-10-CM | POA: Diagnosis not present

## 2022-06-09 DIAGNOSIS — H40052 Ocular hypertension, left eye: Secondary | ICD-10-CM | POA: Diagnosis not present

## 2022-06-09 DIAGNOSIS — F331 Major depressive disorder, recurrent, moderate: Secondary | ICD-10-CM | POA: Diagnosis not present

## 2022-06-15 DIAGNOSIS — F331 Major depressive disorder, recurrent, moderate: Secondary | ICD-10-CM | POA: Diagnosis not present

## 2022-06-15 DIAGNOSIS — F411 Generalized anxiety disorder: Secondary | ICD-10-CM | POA: Diagnosis not present

## 2022-06-15 DIAGNOSIS — Z23 Encounter for immunization: Secondary | ICD-10-CM | POA: Diagnosis not present

## 2022-07-15 DIAGNOSIS — F331 Major depressive disorder, recurrent, moderate: Secondary | ICD-10-CM | POA: Diagnosis not present

## 2022-08-30 DIAGNOSIS — F411 Generalized anxiety disorder: Secondary | ICD-10-CM | POA: Diagnosis not present

## 2022-08-30 DIAGNOSIS — F331 Major depressive disorder, recurrent, moderate: Secondary | ICD-10-CM | POA: Diagnosis not present

## 2022-09-08 DIAGNOSIS — D692 Other nonthrombocytopenic purpura: Secondary | ICD-10-CM | POA: Diagnosis not present

## 2022-09-08 DIAGNOSIS — E559 Vitamin D deficiency, unspecified: Secondary | ICD-10-CM | POA: Diagnosis not present

## 2022-09-08 DIAGNOSIS — I10 Essential (primary) hypertension: Secondary | ICD-10-CM | POA: Diagnosis not present

## 2022-09-08 DIAGNOSIS — I6522 Occlusion and stenosis of left carotid artery: Secondary | ICD-10-CM | POA: Diagnosis not present

## 2022-09-08 DIAGNOSIS — E7800 Pure hypercholesterolemia, unspecified: Secondary | ICD-10-CM | POA: Diagnosis not present

## 2022-09-08 DIAGNOSIS — R7303 Prediabetes: Secondary | ICD-10-CM | POA: Diagnosis not present

## 2022-09-08 DIAGNOSIS — F322 Major depressive disorder, single episode, severe without psychotic features: Secondary | ICD-10-CM | POA: Diagnosis not present

## 2022-09-08 DIAGNOSIS — Z79899 Other long term (current) drug therapy: Secondary | ICD-10-CM | POA: Diagnosis not present

## 2022-09-08 DIAGNOSIS — I4892 Unspecified atrial flutter: Secondary | ICD-10-CM | POA: Diagnosis not present

## 2022-09-08 DIAGNOSIS — Z0000 Encounter for general adult medical examination without abnormal findings: Secondary | ICD-10-CM | POA: Diagnosis not present

## 2022-09-08 DIAGNOSIS — R32 Unspecified urinary incontinence: Secondary | ICD-10-CM | POA: Diagnosis not present

## 2022-09-08 DIAGNOSIS — E538 Deficiency of other specified B group vitamins: Secondary | ICD-10-CM | POA: Diagnosis not present

## 2022-09-14 DIAGNOSIS — R32 Unspecified urinary incontinence: Secondary | ICD-10-CM | POA: Diagnosis not present

## 2022-09-21 DIAGNOSIS — F331 Major depressive disorder, recurrent, moderate: Secondary | ICD-10-CM | POA: Diagnosis not present

## 2022-09-27 DIAGNOSIS — F331 Major depressive disorder, recurrent, moderate: Secondary | ICD-10-CM | POA: Diagnosis not present

## 2022-11-08 DIAGNOSIS — F331 Major depressive disorder, recurrent, moderate: Secondary | ICD-10-CM | POA: Diagnosis not present

## 2022-12-06 DIAGNOSIS — F411 Generalized anxiety disorder: Secondary | ICD-10-CM | POA: Diagnosis not present

## 2022-12-06 DIAGNOSIS — F331 Major depressive disorder, recurrent, moderate: Secondary | ICD-10-CM | POA: Diagnosis not present

## 2022-12-13 DIAGNOSIS — H43393 Other vitreous opacities, bilateral: Secondary | ICD-10-CM | POA: Diagnosis not present

## 2022-12-13 DIAGNOSIS — H40052 Ocular hypertension, left eye: Secondary | ICD-10-CM | POA: Diagnosis not present

## 2022-12-13 DIAGNOSIS — H1820 Unspecified corneal edema: Secondary | ICD-10-CM | POA: Diagnosis not present

## 2022-12-13 DIAGNOSIS — Z961 Presence of intraocular lens: Secondary | ICD-10-CM | POA: Diagnosis not present

## 2023-01-06 DIAGNOSIS — F331 Major depressive disorder, recurrent, moderate: Secondary | ICD-10-CM | POA: Diagnosis not present

## 2023-01-06 DIAGNOSIS — F411 Generalized anxiety disorder: Secondary | ICD-10-CM | POA: Diagnosis not present

## 2023-02-01 DIAGNOSIS — F331 Major depressive disorder, recurrent, moderate: Secondary | ICD-10-CM | POA: Diagnosis not present

## 2023-02-01 DIAGNOSIS — F411 Generalized anxiety disorder: Secondary | ICD-10-CM | POA: Diagnosis not present

## 2023-02-28 DIAGNOSIS — F331 Major depressive disorder, recurrent, moderate: Secondary | ICD-10-CM | POA: Diagnosis not present

## 2023-03-10 DIAGNOSIS — I10 Essential (primary) hypertension: Secondary | ICD-10-CM | POA: Diagnosis not present

## 2023-03-10 DIAGNOSIS — R2689 Other abnormalities of gait and mobility: Secondary | ICD-10-CM | POA: Diagnosis not present

## 2023-03-10 DIAGNOSIS — M25512 Pain in left shoulder: Secondary | ICD-10-CM | POA: Diagnosis not present

## 2023-03-10 DIAGNOSIS — R32 Unspecified urinary incontinence: Secondary | ICD-10-CM | POA: Diagnosis not present

## 2023-03-15 DIAGNOSIS — I10 Essential (primary) hypertension: Secondary | ICD-10-CM | POA: Diagnosis not present

## 2023-03-15 DIAGNOSIS — I4892 Unspecified atrial flutter: Secondary | ICD-10-CM | POA: Diagnosis not present

## 2023-03-15 DIAGNOSIS — N8110 Cystocele, unspecified: Secondary | ICD-10-CM | POA: Diagnosis not present

## 2023-03-15 DIAGNOSIS — Z604 Social exclusion and rejection: Secondary | ICD-10-CM | POA: Diagnosis not present

## 2023-03-15 DIAGNOSIS — M810 Age-related osteoporosis without current pathological fracture: Secondary | ICD-10-CM | POA: Diagnosis not present

## 2023-03-15 DIAGNOSIS — Z7982 Long term (current) use of aspirin: Secondary | ICD-10-CM | POA: Diagnosis not present

## 2023-03-15 DIAGNOSIS — M25512 Pain in left shoulder: Secondary | ICD-10-CM | POA: Diagnosis not present

## 2023-03-15 DIAGNOSIS — E538 Deficiency of other specified B group vitamins: Secondary | ICD-10-CM | POA: Diagnosis not present

## 2023-03-15 DIAGNOSIS — Z85820 Personal history of malignant melanoma of skin: Secondary | ICD-10-CM | POA: Diagnosis not present

## 2023-03-15 DIAGNOSIS — I6522 Occlusion and stenosis of left carotid artery: Secondary | ICD-10-CM | POA: Diagnosis not present

## 2023-03-15 DIAGNOSIS — F607 Dependent personality disorder: Secondary | ICD-10-CM | POA: Diagnosis not present

## 2023-03-15 DIAGNOSIS — M1990 Unspecified osteoarthritis, unspecified site: Secondary | ICD-10-CM | POA: Diagnosis not present

## 2023-03-15 DIAGNOSIS — G8929 Other chronic pain: Secondary | ICD-10-CM | POA: Diagnosis not present

## 2023-03-15 DIAGNOSIS — F3342 Major depressive disorder, recurrent, in full remission: Secondary | ICD-10-CM | POA: Diagnosis not present

## 2023-03-15 DIAGNOSIS — D692 Other nonthrombocytopenic purpura: Secondary | ICD-10-CM | POA: Diagnosis not present

## 2023-03-16 DIAGNOSIS — F331 Major depressive disorder, recurrent, moderate: Secondary | ICD-10-CM | POA: Diagnosis not present

## 2023-03-21 DIAGNOSIS — M25512 Pain in left shoulder: Secondary | ICD-10-CM | POA: Diagnosis not present

## 2023-03-21 DIAGNOSIS — G8929 Other chronic pain: Secondary | ICD-10-CM | POA: Diagnosis not present

## 2023-03-21 DIAGNOSIS — F3342 Major depressive disorder, recurrent, in full remission: Secondary | ICD-10-CM | POA: Diagnosis not present

## 2023-03-21 DIAGNOSIS — D692 Other nonthrombocytopenic purpura: Secondary | ICD-10-CM | POA: Diagnosis not present

## 2023-03-21 DIAGNOSIS — I4892 Unspecified atrial flutter: Secondary | ICD-10-CM | POA: Diagnosis not present

## 2023-03-21 DIAGNOSIS — F607 Dependent personality disorder: Secondary | ICD-10-CM | POA: Diagnosis not present

## 2023-03-28 DIAGNOSIS — F607 Dependent personality disorder: Secondary | ICD-10-CM | POA: Diagnosis not present

## 2023-03-28 DIAGNOSIS — D692 Other nonthrombocytopenic purpura: Secondary | ICD-10-CM | POA: Diagnosis not present

## 2023-03-28 DIAGNOSIS — M25512 Pain in left shoulder: Secondary | ICD-10-CM | POA: Diagnosis not present

## 2023-03-28 DIAGNOSIS — G8929 Other chronic pain: Secondary | ICD-10-CM | POA: Diagnosis not present

## 2023-03-28 DIAGNOSIS — I4892 Unspecified atrial flutter: Secondary | ICD-10-CM | POA: Diagnosis not present

## 2023-03-28 DIAGNOSIS — F3342 Major depressive disorder, recurrent, in full remission: Secondary | ICD-10-CM | POA: Diagnosis not present

## 2023-04-04 DIAGNOSIS — I4892 Unspecified atrial flutter: Secondary | ICD-10-CM | POA: Diagnosis not present

## 2023-04-04 DIAGNOSIS — M25512 Pain in left shoulder: Secondary | ICD-10-CM | POA: Diagnosis not present

## 2023-04-04 DIAGNOSIS — D692 Other nonthrombocytopenic purpura: Secondary | ICD-10-CM | POA: Diagnosis not present

## 2023-04-04 DIAGNOSIS — F3342 Major depressive disorder, recurrent, in full remission: Secondary | ICD-10-CM | POA: Diagnosis not present

## 2023-04-04 DIAGNOSIS — G8929 Other chronic pain: Secondary | ICD-10-CM | POA: Diagnosis not present

## 2023-04-04 DIAGNOSIS — F607 Dependent personality disorder: Secondary | ICD-10-CM | POA: Diagnosis not present

## 2023-04-11 DIAGNOSIS — F607 Dependent personality disorder: Secondary | ICD-10-CM | POA: Diagnosis not present

## 2023-04-11 DIAGNOSIS — M25512 Pain in left shoulder: Secondary | ICD-10-CM | POA: Diagnosis not present

## 2023-04-11 DIAGNOSIS — D692 Other nonthrombocytopenic purpura: Secondary | ICD-10-CM | POA: Diagnosis not present

## 2023-04-11 DIAGNOSIS — I4892 Unspecified atrial flutter: Secondary | ICD-10-CM | POA: Diagnosis not present

## 2023-04-11 DIAGNOSIS — G8929 Other chronic pain: Secondary | ICD-10-CM | POA: Diagnosis not present

## 2023-04-11 DIAGNOSIS — F3342 Major depressive disorder, recurrent, in full remission: Secondary | ICD-10-CM | POA: Diagnosis not present

## 2023-04-13 DIAGNOSIS — F411 Generalized anxiety disorder: Secondary | ICD-10-CM | POA: Diagnosis not present

## 2023-04-13 DIAGNOSIS — F331 Major depressive disorder, recurrent, moderate: Secondary | ICD-10-CM | POA: Diagnosis not present

## 2023-04-14 DIAGNOSIS — M810 Age-related osteoporosis without current pathological fracture: Secondary | ICD-10-CM | POA: Diagnosis not present

## 2023-04-14 DIAGNOSIS — D692 Other nonthrombocytopenic purpura: Secondary | ICD-10-CM | POA: Diagnosis not present

## 2023-04-14 DIAGNOSIS — G8929 Other chronic pain: Secondary | ICD-10-CM | POA: Diagnosis not present

## 2023-04-14 DIAGNOSIS — I10 Essential (primary) hypertension: Secondary | ICD-10-CM | POA: Diagnosis not present

## 2023-04-14 DIAGNOSIS — E538 Deficiency of other specified B group vitamins: Secondary | ICD-10-CM | POA: Diagnosis not present

## 2023-04-14 DIAGNOSIS — F607 Dependent personality disorder: Secondary | ICD-10-CM | POA: Diagnosis not present

## 2023-04-14 DIAGNOSIS — F3342 Major depressive disorder, recurrent, in full remission: Secondary | ICD-10-CM | POA: Diagnosis not present

## 2023-04-14 DIAGNOSIS — Z7982 Long term (current) use of aspirin: Secondary | ICD-10-CM | POA: Diagnosis not present

## 2023-04-14 DIAGNOSIS — Z604 Social exclusion and rejection: Secondary | ICD-10-CM | POA: Diagnosis not present

## 2023-04-14 DIAGNOSIS — N8110 Cystocele, unspecified: Secondary | ICD-10-CM | POA: Diagnosis not present

## 2023-04-14 DIAGNOSIS — Z85820 Personal history of malignant melanoma of skin: Secondary | ICD-10-CM | POA: Diagnosis not present

## 2023-04-14 DIAGNOSIS — M1990 Unspecified osteoarthritis, unspecified site: Secondary | ICD-10-CM | POA: Diagnosis not present

## 2023-04-14 DIAGNOSIS — M25512 Pain in left shoulder: Secondary | ICD-10-CM | POA: Diagnosis not present

## 2023-04-14 DIAGNOSIS — I4892 Unspecified atrial flutter: Secondary | ICD-10-CM | POA: Diagnosis not present

## 2023-04-14 DIAGNOSIS — I6522 Occlusion and stenosis of left carotid artery: Secondary | ICD-10-CM | POA: Diagnosis not present

## 2023-04-20 DIAGNOSIS — F3342 Major depressive disorder, recurrent, in full remission: Secondary | ICD-10-CM | POA: Diagnosis not present

## 2023-04-20 DIAGNOSIS — D692 Other nonthrombocytopenic purpura: Secondary | ICD-10-CM | POA: Diagnosis not present

## 2023-04-20 DIAGNOSIS — I4892 Unspecified atrial flutter: Secondary | ICD-10-CM | POA: Diagnosis not present

## 2023-04-20 DIAGNOSIS — M25512 Pain in left shoulder: Secondary | ICD-10-CM | POA: Diagnosis not present

## 2023-04-20 DIAGNOSIS — G8929 Other chronic pain: Secondary | ICD-10-CM | POA: Diagnosis not present

## 2023-04-20 DIAGNOSIS — F607 Dependent personality disorder: Secondary | ICD-10-CM | POA: Diagnosis not present

## 2023-04-27 DIAGNOSIS — F607 Dependent personality disorder: Secondary | ICD-10-CM | POA: Diagnosis not present

## 2023-04-27 DIAGNOSIS — G8929 Other chronic pain: Secondary | ICD-10-CM | POA: Diagnosis not present

## 2023-04-27 DIAGNOSIS — I4892 Unspecified atrial flutter: Secondary | ICD-10-CM | POA: Diagnosis not present

## 2023-04-27 DIAGNOSIS — D692 Other nonthrombocytopenic purpura: Secondary | ICD-10-CM | POA: Diagnosis not present

## 2023-04-27 DIAGNOSIS — F3342 Major depressive disorder, recurrent, in full remission: Secondary | ICD-10-CM | POA: Diagnosis not present

## 2023-04-27 DIAGNOSIS — M25512 Pain in left shoulder: Secondary | ICD-10-CM | POA: Diagnosis not present

## 2023-05-02 DIAGNOSIS — F331 Major depressive disorder, recurrent, moderate: Secondary | ICD-10-CM | POA: Diagnosis not present

## 2023-05-04 DIAGNOSIS — M25512 Pain in left shoulder: Secondary | ICD-10-CM | POA: Diagnosis not present

## 2023-05-04 DIAGNOSIS — G8929 Other chronic pain: Secondary | ICD-10-CM | POA: Diagnosis not present

## 2023-05-04 DIAGNOSIS — I4892 Unspecified atrial flutter: Secondary | ICD-10-CM | POA: Diagnosis not present

## 2023-05-04 DIAGNOSIS — F3342 Major depressive disorder, recurrent, in full remission: Secondary | ICD-10-CM | POA: Diagnosis not present

## 2023-05-04 DIAGNOSIS — D692 Other nonthrombocytopenic purpura: Secondary | ICD-10-CM | POA: Diagnosis not present

## 2023-05-04 DIAGNOSIS — F607 Dependent personality disorder: Secondary | ICD-10-CM | POA: Diagnosis not present

## 2023-05-09 DIAGNOSIS — F3342 Major depressive disorder, recurrent, in full remission: Secondary | ICD-10-CM | POA: Diagnosis not present

## 2023-05-09 DIAGNOSIS — G8929 Other chronic pain: Secondary | ICD-10-CM | POA: Diagnosis not present

## 2023-05-09 DIAGNOSIS — F607 Dependent personality disorder: Secondary | ICD-10-CM | POA: Diagnosis not present

## 2023-05-09 DIAGNOSIS — I4892 Unspecified atrial flutter: Secondary | ICD-10-CM | POA: Diagnosis not present

## 2023-05-09 DIAGNOSIS — M25512 Pain in left shoulder: Secondary | ICD-10-CM | POA: Diagnosis not present

## 2023-05-09 DIAGNOSIS — D692 Other nonthrombocytopenic purpura: Secondary | ICD-10-CM | POA: Diagnosis not present

## 2023-05-14 DIAGNOSIS — M810 Age-related osteoporosis without current pathological fracture: Secondary | ICD-10-CM | POA: Diagnosis not present

## 2023-05-14 DIAGNOSIS — Z8673 Personal history of transient ischemic attack (TIA), and cerebral infarction without residual deficits: Secondary | ICD-10-CM | POA: Diagnosis not present

## 2023-05-14 DIAGNOSIS — Z85820 Personal history of malignant melanoma of skin: Secondary | ICD-10-CM | POA: Diagnosis not present

## 2023-05-14 DIAGNOSIS — M25512 Pain in left shoulder: Secondary | ICD-10-CM | POA: Diagnosis not present

## 2023-05-14 DIAGNOSIS — R32 Unspecified urinary incontinence: Secondary | ICD-10-CM | POA: Diagnosis not present

## 2023-05-14 DIAGNOSIS — N8110 Cystocele, unspecified: Secondary | ICD-10-CM | POA: Diagnosis not present

## 2023-05-14 DIAGNOSIS — M1990 Unspecified osteoarthritis, unspecified site: Secondary | ICD-10-CM | POA: Diagnosis not present

## 2023-05-14 DIAGNOSIS — D692 Other nonthrombocytopenic purpura: Secondary | ICD-10-CM | POA: Diagnosis not present

## 2023-05-14 DIAGNOSIS — Z604 Social exclusion and rejection: Secondary | ICD-10-CM | POA: Diagnosis not present

## 2023-05-14 DIAGNOSIS — E538 Deficiency of other specified B group vitamins: Secondary | ICD-10-CM | POA: Diagnosis not present

## 2023-05-14 DIAGNOSIS — F3342 Major depressive disorder, recurrent, in full remission: Secondary | ICD-10-CM | POA: Diagnosis not present

## 2023-05-14 DIAGNOSIS — I4892 Unspecified atrial flutter: Secondary | ICD-10-CM | POA: Diagnosis not present

## 2023-05-14 DIAGNOSIS — G8929 Other chronic pain: Secondary | ICD-10-CM | POA: Diagnosis not present

## 2023-05-14 DIAGNOSIS — I10 Essential (primary) hypertension: Secondary | ICD-10-CM | POA: Diagnosis not present

## 2023-05-14 DIAGNOSIS — Z7982 Long term (current) use of aspirin: Secondary | ICD-10-CM | POA: Diagnosis not present

## 2023-05-14 DIAGNOSIS — F607 Dependent personality disorder: Secondary | ICD-10-CM | POA: Diagnosis not present

## 2023-05-18 DIAGNOSIS — D692 Other nonthrombocytopenic purpura: Secondary | ICD-10-CM | POA: Diagnosis not present

## 2023-05-18 DIAGNOSIS — F3342 Major depressive disorder, recurrent, in full remission: Secondary | ICD-10-CM | POA: Diagnosis not present

## 2023-05-18 DIAGNOSIS — F607 Dependent personality disorder: Secondary | ICD-10-CM | POA: Diagnosis not present

## 2023-05-18 DIAGNOSIS — G8929 Other chronic pain: Secondary | ICD-10-CM | POA: Diagnosis not present

## 2023-05-18 DIAGNOSIS — I4892 Unspecified atrial flutter: Secondary | ICD-10-CM | POA: Diagnosis not present

## 2023-05-18 DIAGNOSIS — M25512 Pain in left shoulder: Secondary | ICD-10-CM | POA: Diagnosis not present

## 2023-05-25 DIAGNOSIS — I4892 Unspecified atrial flutter: Secondary | ICD-10-CM | POA: Diagnosis not present

## 2023-05-25 DIAGNOSIS — F607 Dependent personality disorder: Secondary | ICD-10-CM | POA: Diagnosis not present

## 2023-05-25 DIAGNOSIS — D692 Other nonthrombocytopenic purpura: Secondary | ICD-10-CM | POA: Diagnosis not present

## 2023-05-25 DIAGNOSIS — F3342 Major depressive disorder, recurrent, in full remission: Secondary | ICD-10-CM | POA: Diagnosis not present

## 2023-05-25 DIAGNOSIS — M25512 Pain in left shoulder: Secondary | ICD-10-CM | POA: Diagnosis not present

## 2023-05-25 DIAGNOSIS — G8929 Other chronic pain: Secondary | ICD-10-CM | POA: Diagnosis not present

## 2023-05-31 DIAGNOSIS — F331 Major depressive disorder, recurrent, moderate: Secondary | ICD-10-CM | POA: Diagnosis not present

## 2023-05-31 DIAGNOSIS — F411 Generalized anxiety disorder: Secondary | ICD-10-CM | POA: Diagnosis not present

## 2023-06-01 DIAGNOSIS — G8929 Other chronic pain: Secondary | ICD-10-CM | POA: Diagnosis not present

## 2023-06-01 DIAGNOSIS — M25512 Pain in left shoulder: Secondary | ICD-10-CM | POA: Diagnosis not present

## 2023-06-01 DIAGNOSIS — I4892 Unspecified atrial flutter: Secondary | ICD-10-CM | POA: Diagnosis not present

## 2023-06-01 DIAGNOSIS — F3342 Major depressive disorder, recurrent, in full remission: Secondary | ICD-10-CM | POA: Diagnosis not present

## 2023-06-01 DIAGNOSIS — F607 Dependent personality disorder: Secondary | ICD-10-CM | POA: Diagnosis not present

## 2023-06-01 DIAGNOSIS — D692 Other nonthrombocytopenic purpura: Secondary | ICD-10-CM | POA: Diagnosis not present

## 2023-06-06 DIAGNOSIS — D692 Other nonthrombocytopenic purpura: Secondary | ICD-10-CM | POA: Diagnosis not present

## 2023-06-06 DIAGNOSIS — I4892 Unspecified atrial flutter: Secondary | ICD-10-CM | POA: Diagnosis not present

## 2023-06-06 DIAGNOSIS — F3342 Major depressive disorder, recurrent, in full remission: Secondary | ICD-10-CM | POA: Diagnosis not present

## 2023-06-06 DIAGNOSIS — M25512 Pain in left shoulder: Secondary | ICD-10-CM | POA: Diagnosis not present

## 2023-06-06 DIAGNOSIS — F607 Dependent personality disorder: Secondary | ICD-10-CM | POA: Diagnosis not present

## 2023-06-06 DIAGNOSIS — G8929 Other chronic pain: Secondary | ICD-10-CM | POA: Diagnosis not present

## 2023-06-13 DIAGNOSIS — G8929 Other chronic pain: Secondary | ICD-10-CM | POA: Diagnosis not present

## 2023-06-13 DIAGNOSIS — F607 Dependent personality disorder: Secondary | ICD-10-CM | POA: Diagnosis not present

## 2023-06-13 DIAGNOSIS — M25512 Pain in left shoulder: Secondary | ICD-10-CM | POA: Diagnosis not present

## 2023-06-13 DIAGNOSIS — I10 Essential (primary) hypertension: Secondary | ICD-10-CM | POA: Diagnosis not present

## 2023-06-13 DIAGNOSIS — I4892 Unspecified atrial flutter: Secondary | ICD-10-CM | POA: Diagnosis not present

## 2023-06-13 DIAGNOSIS — Z8673 Personal history of transient ischemic attack (TIA), and cerebral infarction without residual deficits: Secondary | ICD-10-CM | POA: Diagnosis not present

## 2023-06-13 DIAGNOSIS — M1990 Unspecified osteoarthritis, unspecified site: Secondary | ICD-10-CM | POA: Diagnosis not present

## 2023-06-13 DIAGNOSIS — F3342 Major depressive disorder, recurrent, in full remission: Secondary | ICD-10-CM | POA: Diagnosis not present

## 2023-06-13 DIAGNOSIS — Z7982 Long term (current) use of aspirin: Secondary | ICD-10-CM | POA: Diagnosis not present

## 2023-06-13 DIAGNOSIS — R32 Unspecified urinary incontinence: Secondary | ICD-10-CM | POA: Diagnosis not present

## 2023-06-13 DIAGNOSIS — D692 Other nonthrombocytopenic purpura: Secondary | ICD-10-CM | POA: Diagnosis not present

## 2023-06-13 DIAGNOSIS — M810 Age-related osteoporosis without current pathological fracture: Secondary | ICD-10-CM | POA: Diagnosis not present

## 2023-06-13 DIAGNOSIS — N8110 Cystocele, unspecified: Secondary | ICD-10-CM | POA: Diagnosis not present

## 2023-06-13 DIAGNOSIS — E538 Deficiency of other specified B group vitamins: Secondary | ICD-10-CM | POA: Diagnosis not present

## 2023-06-13 DIAGNOSIS — Z85820 Personal history of malignant melanoma of skin: Secondary | ICD-10-CM | POA: Diagnosis not present

## 2023-06-13 DIAGNOSIS — F331 Major depressive disorder, recurrent, moderate: Secondary | ICD-10-CM | POA: Diagnosis not present

## 2023-06-13 DIAGNOSIS — Z604 Social exclusion and rejection: Secondary | ICD-10-CM | POA: Diagnosis not present

## 2023-06-20 DIAGNOSIS — H1820 Unspecified corneal edema: Secondary | ICD-10-CM | POA: Diagnosis not present

## 2023-06-20 DIAGNOSIS — H40052 Ocular hypertension, left eye: Secondary | ICD-10-CM | POA: Diagnosis not present

## 2023-06-22 DIAGNOSIS — F607 Dependent personality disorder: Secondary | ICD-10-CM | POA: Diagnosis not present

## 2023-06-22 DIAGNOSIS — F3342 Major depressive disorder, recurrent, in full remission: Secondary | ICD-10-CM | POA: Diagnosis not present

## 2023-06-22 DIAGNOSIS — M25512 Pain in left shoulder: Secondary | ICD-10-CM | POA: Diagnosis not present

## 2023-06-22 DIAGNOSIS — I4892 Unspecified atrial flutter: Secondary | ICD-10-CM | POA: Diagnosis not present

## 2023-06-22 DIAGNOSIS — G8929 Other chronic pain: Secondary | ICD-10-CM | POA: Diagnosis not present

## 2023-06-22 DIAGNOSIS — D692 Other nonthrombocytopenic purpura: Secondary | ICD-10-CM | POA: Diagnosis not present

## 2023-06-29 DIAGNOSIS — D692 Other nonthrombocytopenic purpura: Secondary | ICD-10-CM | POA: Diagnosis not present

## 2023-06-29 DIAGNOSIS — I4892 Unspecified atrial flutter: Secondary | ICD-10-CM | POA: Diagnosis not present

## 2023-06-29 DIAGNOSIS — F607 Dependent personality disorder: Secondary | ICD-10-CM | POA: Diagnosis not present

## 2023-06-29 DIAGNOSIS — F3342 Major depressive disorder, recurrent, in full remission: Secondary | ICD-10-CM | POA: Diagnosis not present

## 2023-06-29 DIAGNOSIS — G8929 Other chronic pain: Secondary | ICD-10-CM | POA: Diagnosis not present

## 2023-06-29 DIAGNOSIS — M25512 Pain in left shoulder: Secondary | ICD-10-CM | POA: Diagnosis not present

## 2023-07-04 DIAGNOSIS — F331 Major depressive disorder, recurrent, moderate: Secondary | ICD-10-CM | POA: Diagnosis not present

## 2023-07-06 DIAGNOSIS — I4892 Unspecified atrial flutter: Secondary | ICD-10-CM | POA: Diagnosis not present

## 2023-07-06 DIAGNOSIS — D692 Other nonthrombocytopenic purpura: Secondary | ICD-10-CM | POA: Diagnosis not present

## 2023-07-06 DIAGNOSIS — F607 Dependent personality disorder: Secondary | ICD-10-CM | POA: Diagnosis not present

## 2023-07-06 DIAGNOSIS — G8929 Other chronic pain: Secondary | ICD-10-CM | POA: Diagnosis not present

## 2023-07-06 DIAGNOSIS — M25512 Pain in left shoulder: Secondary | ICD-10-CM | POA: Diagnosis not present

## 2023-07-06 DIAGNOSIS — F3342 Major depressive disorder, recurrent, in full remission: Secondary | ICD-10-CM | POA: Diagnosis not present

## 2023-08-02 DIAGNOSIS — F331 Major depressive disorder, recurrent, moderate: Secondary | ICD-10-CM | POA: Diagnosis not present

## 2023-08-15 DIAGNOSIS — F331 Major depressive disorder, recurrent, moderate: Secondary | ICD-10-CM | POA: Diagnosis not present

## 2023-09-13 DIAGNOSIS — Z79899 Other long term (current) drug therapy: Secondary | ICD-10-CM | POA: Diagnosis not present

## 2023-09-13 DIAGNOSIS — I4892 Unspecified atrial flutter: Secondary | ICD-10-CM | POA: Diagnosis not present

## 2023-09-13 DIAGNOSIS — E559 Vitamin D deficiency, unspecified: Secondary | ICD-10-CM | POA: Diagnosis not present

## 2023-09-13 DIAGNOSIS — I10 Essential (primary) hypertension: Secondary | ICD-10-CM | POA: Diagnosis not present

## 2023-09-13 DIAGNOSIS — M1990 Unspecified osteoarthritis, unspecified site: Secondary | ICD-10-CM | POA: Diagnosis not present

## 2023-09-13 DIAGNOSIS — Z0000 Encounter for general adult medical examination without abnormal findings: Secondary | ICD-10-CM | POA: Diagnosis not present

## 2023-09-13 DIAGNOSIS — F322 Major depressive disorder, single episode, severe without psychotic features: Secondary | ICD-10-CM | POA: Diagnosis not present

## 2023-09-13 DIAGNOSIS — Z23 Encounter for immunization: Secondary | ICD-10-CM | POA: Diagnosis not present

## 2023-09-13 DIAGNOSIS — E538 Deficiency of other specified B group vitamins: Secondary | ICD-10-CM | POA: Diagnosis not present

## 2023-09-13 DIAGNOSIS — R7303 Prediabetes: Secondary | ICD-10-CM | POA: Diagnosis not present

## 2023-10-13 DIAGNOSIS — F331 Major depressive disorder, recurrent, moderate: Secondary | ICD-10-CM | POA: Diagnosis not present

## 2023-10-31 DIAGNOSIS — F331 Major depressive disorder, recurrent, moderate: Secondary | ICD-10-CM | POA: Diagnosis not present

## 2023-11-09 DIAGNOSIS — F331 Major depressive disorder, recurrent, moderate: Secondary | ICD-10-CM | POA: Diagnosis not present

## 2023-12-08 DIAGNOSIS — F331 Major depressive disorder, recurrent, moderate: Secondary | ICD-10-CM | POA: Diagnosis not present

## 2023-12-19 DIAGNOSIS — H1820 Unspecified corneal edema: Secondary | ICD-10-CM | POA: Diagnosis not present

## 2023-12-19 DIAGNOSIS — H40052 Ocular hypertension, left eye: Secondary | ICD-10-CM | POA: Diagnosis not present

## 2023-12-19 DIAGNOSIS — H35372 Puckering of macula, left eye: Secondary | ICD-10-CM | POA: Diagnosis not present

## 2024-01-18 DIAGNOSIS — F331 Major depressive disorder, recurrent, moderate: Secondary | ICD-10-CM | POA: Diagnosis not present

## 2024-01-30 DIAGNOSIS — F331 Major depressive disorder, recurrent, moderate: Secondary | ICD-10-CM | POA: Diagnosis not present

## 2024-03-15 DIAGNOSIS — F322 Major depressive disorder, single episode, severe without psychotic features: Secondary | ICD-10-CM | POA: Diagnosis not present

## 2024-03-15 DIAGNOSIS — M25519 Pain in unspecified shoulder: Secondary | ICD-10-CM | POA: Diagnosis not present

## 2024-03-15 DIAGNOSIS — Z8673 Personal history of transient ischemic attack (TIA), and cerebral infarction without residual deficits: Secondary | ICD-10-CM | POA: Diagnosis not present

## 2024-04-04 ENCOUNTER — Other Ambulatory Visit (HOSPITAL_BASED_OUTPATIENT_CLINIC_OR_DEPARTMENT_OTHER): Payer: Self-pay | Admitting: Family Medicine

## 2024-04-04 DIAGNOSIS — Z1231 Encounter for screening mammogram for malignant neoplasm of breast: Secondary | ICD-10-CM

## 2024-04-07 ENCOUNTER — Ambulatory Visit (HOSPITAL_BASED_OUTPATIENT_CLINIC_OR_DEPARTMENT_OTHER)
Admission: RE | Admit: 2024-04-07 | Discharge: 2024-04-07 | Disposition: A | Discharge status: Home / Self Care | Source: Ambulatory Visit | Attending: Family Medicine | Admitting: Family Medicine

## 2024-04-07 ENCOUNTER — Encounter (HOSPITAL_BASED_OUTPATIENT_CLINIC_OR_DEPARTMENT_OTHER): Payer: Self-pay | Admitting: Radiology

## 2024-04-07 DIAGNOSIS — Z1231 Encounter for screening mammogram for malignant neoplasm of breast: Secondary | ICD-10-CM | POA: Insufficient documentation

## 2024-04-17 DIAGNOSIS — F331 Major depressive disorder, recurrent, moderate: Secondary | ICD-10-CM | POA: Diagnosis not present

## 2024-04-29 ENCOUNTER — Other Ambulatory Visit (HOSPITAL_BASED_OUTPATIENT_CLINIC_OR_DEPARTMENT_OTHER): Payer: Self-pay | Admitting: Family Medicine

## 2024-04-29 DIAGNOSIS — E2839 Other primary ovarian failure: Secondary | ICD-10-CM

## 2024-05-23 DIAGNOSIS — F411 Generalized anxiety disorder: Secondary | ICD-10-CM | POA: Diagnosis not present

## 2024-05-23 DIAGNOSIS — F331 Major depressive disorder, recurrent, moderate: Secondary | ICD-10-CM | POA: Diagnosis not present

## 2024-06-04 DIAGNOSIS — Z23 Encounter for immunization: Secondary | ICD-10-CM | POA: Diagnosis not present

## 2024-06-25 DIAGNOSIS — F331 Major depressive disorder, recurrent, moderate: Secondary | ICD-10-CM | POA: Diagnosis not present

## 2024-06-28 DIAGNOSIS — H40052 Ocular hypertension, left eye: Secondary | ICD-10-CM | POA: Diagnosis not present

## 2024-07-04 DIAGNOSIS — F331 Major depressive disorder, recurrent, moderate: Secondary | ICD-10-CM | POA: Diagnosis not present

## 2024-07-08 DIAGNOSIS — H1130 Conjunctival hemorrhage, unspecified eye: Secondary | ICD-10-CM | POA: Diagnosis not present

## 2024-07-28 DIAGNOSIS — H40052 Ocular hypertension, left eye: Secondary | ICD-10-CM | POA: Diagnosis not present

## 2024-07-28 DIAGNOSIS — H1131 Conjunctival hemorrhage, right eye: Secondary | ICD-10-CM | POA: Diagnosis not present

## 2024-09-24 ENCOUNTER — Other Ambulatory Visit (HOSPITAL_BASED_OUTPATIENT_CLINIC_OR_DEPARTMENT_OTHER)
